# Patient Record
Sex: Female | Born: 1955 | Race: White | Hispanic: No | Marital: Married | Smoking: Current every day smoker
Health system: Southern US, Community
[De-identification: ages and names within clinical notes are randomized; demographics above are authoritative.]

## PROBLEM LIST (undated history)

## (undated) DIAGNOSIS — J449 Chronic obstructive pulmonary disease, unspecified: Secondary | ICD-10-CM

## (undated) DIAGNOSIS — G4730 Sleep apnea, unspecified: Secondary | ICD-10-CM

## (undated) DIAGNOSIS — M1712 Unilateral primary osteoarthritis, left knee: Secondary | ICD-10-CM

## (undated) DIAGNOSIS — I10 Essential (primary) hypertension: Secondary | ICD-10-CM

## (undated) DIAGNOSIS — H9190 Unspecified hearing loss, unspecified ear: Secondary | ICD-10-CM

## (undated) DIAGNOSIS — M47816 Spondylosis without myelopathy or radiculopathy, lumbar region: Secondary | ICD-10-CM

## (undated) DIAGNOSIS — R32 Unspecified urinary incontinence: Secondary | ICD-10-CM

## (undated) DIAGNOSIS — F40240 Claustrophobia: Secondary | ICD-10-CM

## (undated) DIAGNOSIS — F419 Anxiety disorder, unspecified: Secondary | ICD-10-CM

## (undated) DIAGNOSIS — H8109 Meniere's disease, unspecified ear: Secondary | ICD-10-CM

## (undated) DIAGNOSIS — F332 Major depressive disorder, recurrent severe without psychotic features: Secondary | ICD-10-CM

## (undated) DIAGNOSIS — R112 Nausea with vomiting, unspecified: Secondary | ICD-10-CM

## (undated) DIAGNOSIS — K219 Gastro-esophageal reflux disease without esophagitis: Secondary | ICD-10-CM

## (undated) DIAGNOSIS — F329 Major depressive disorder, single episode, unspecified: Secondary | ICD-10-CM

## (undated) DIAGNOSIS — Z9889 Other specified postprocedural states: Secondary | ICD-10-CM

## (undated) DIAGNOSIS — F32A Depression, unspecified: Secondary | ICD-10-CM

## (undated) HISTORY — DX: Meniere's disease, unspecified ear: H81.09

## (undated) HISTORY — DX: Spondylosis without myelopathy or radiculopathy, lumbar region: M47.816

## (undated) HISTORY — PX: CARPAL TUNNEL RELEASE: SHX101

## (undated) HISTORY — DX: Essential (primary) hypertension: I10

## (undated) HISTORY — PX: BLADDER SUSPENSION: SHX72

## (undated) HISTORY — DX: Chronic obstructive pulmonary disease, unspecified: J44.9

## (undated) HISTORY — PX: ABDOMINAL HYSTERECTOMY: SHX81

## (undated) HISTORY — PX: EYE SURGERY: SHX253

## (undated) HISTORY — DX: Major depressive disorder, recurrent severe without psychotic features: F33.2

---

## 1998-08-04 ENCOUNTER — Inpatient Hospital Stay (HOSPITAL_COMMUNITY): Admission: RE | Admit: 1998-08-04 | Discharge: 1998-08-07 | Payer: Self-pay | Admitting: Obstetrics and Gynecology

## 2001-01-02 ENCOUNTER — Other Ambulatory Visit: Admission: RE | Admit: 2001-01-02 | Discharge: 2001-01-02 | Payer: Self-pay | Admitting: Obstetrics and Gynecology

## 2001-12-03 HISTORY — PX: CARPAL TUNNEL RELEASE: SHX101

## 2004-03-16 ENCOUNTER — Other Ambulatory Visit: Admission: RE | Admit: 2004-03-16 | Discharge: 2004-03-16 | Payer: Self-pay | Admitting: Obstetrics and Gynecology

## 2004-12-03 HISTORY — PX: KNEE ARTHROSCOPY: SUR90

## 2005-04-05 ENCOUNTER — Ambulatory Visit (HOSPITAL_COMMUNITY): Admission: RE | Admit: 2005-04-05 | Discharge: 2005-04-06 | Payer: Self-pay | Admitting: Orthopedic Surgery

## 2007-12-23 ENCOUNTER — Ambulatory Visit (HOSPITAL_COMMUNITY): Admission: RE | Admit: 2007-12-23 | Discharge: 2007-12-24 | Payer: Self-pay | Admitting: Obstetrics and Gynecology

## 2011-04-17 NOTE — Op Note (Signed)
[name], [name]               ACCOUNT NO.:  [id number]   MEDICAL RECORD NO.:  [id number]          PATIENT TYPE:  AMB   LOCATION:  SDC                           FACILITY:  [facility]   PHYSICIAN:  Miguel Aschoff, M.D.       DATE OF BIRTH:  1956-01-20   DATE OF PROCEDURE:  12/23/2007  DATE OF DISCHARGE:                               OPERATIVE REPORT   PREOPERATIVE DIAGNOSIS:  Cystocele, rectocele, urinary stress  incontinence with urethrocele.   POSTOPERATIVE DIAGNOSIS:  Cystocele, rectocele, urinary stress  incontinence with urethrocele.   PROCEDURE:  Anterior and posterior colporrhaphy.   SURGEON:  Miguel Aschoff, M.D.   ASSISTANT:  Randye Lobo, M.D.   ANESTHESIA:  General.   COMPLICATIONS:  None.   INDICATIONS FOR PROCEDURE:  The patient is a 55 year old white female  status post prior hysterectomy and Marshall-Marchetti procedure who has  now urinary stress incontinence with urethrocele.  She is noted to have  a cystocele and rectocele.  Because of symptoms associated with the  urinary stress incontinence and prior evaluation by Dr. Conley Simmonds with  urodynamic, she presents now to undergo repair of these pelvic floor  defects as well as a TVT sling which will be placed by Dr. Edward Jolly. The  risks and benefits have been discussed with the patient and informed  consent has been obtained.   PROCEDURE IN DETAIL:  The patient was taken to the operating room and  placed in a supine position.  General anesthesia was administered  without difficulty.  She was then placed in the dorsal lithotomy  position, prepped and draped in the usual sterile fashion.  The bladder  was catheterized.  At this point, the anterior vaginal wall was injected  with 1% Xylocaine with epinephrine and then the anterior wall was  incised with a scalpel and dissected anteriorly and posteriorly to  approximately 2 cm of the urethral orifice and 2 cm to the apex of the  vaginal cuff.  Once this anterior vaginal  wall was opened, the  paravesical fascia was dissected free from the vaginal mucosa and the  cystocele identified and outlined.  At this point, Dr. Edward Jolly proceeded  to place a TVT sling and this was dictated as a separate operative note.  Once the sling was then placed, the cystocele was reduced by serial  pursestring sutures of 2-0 Vicryl followed by imbricating sutures of 2-0  Vicryl with complete closure of the cystocele. At this point, the excess  vaginal mucosa was trimmed and then the mucosa was reapproximated by  using running continuous interlocking 2-0 Vicryl suture. Once this was  done, attention was directed to the posterior vaginal wall.  An ellipse  of perineal skin was then incised and elevated and then the posterior  wall was injected with 1% Xylocaine with epinephrine. At this point, the  vaginal mucosa was separated from the underlying pararectal fascia  without difficulty and, again, it was easy to identify the rectocele.  Once again, the rectocele was reduced using pursestring sutures of 2-0  Vicryl suture and then further reinforced using interrupted 2-0  Vicryl  sutures to reapproximate the pararectal fascia.  At this point, the  excess vaginal mucosa was trimmed and then the mucosa was reapproximated  using running continuous 2-0 Vicryl suture.  At this point, the perineal  body was reconstituted using a 0 Vicryl crown suture bringing the  peritoneal muscles into the midline. Then, the perineal skin was  reapproximated using  subcutaneous 2-0 Vicryl suture followed by subcuticular 2-0 Vicryl  suture. After this was done, a pack was placed in the vaginal vault for  hemostasis.  The estimated blood loss from the procedures was  approximately 200 mL.  The patient tolerated the procedure well and went  to the recovery room in satisfactory condition.      Miguel Aschoff, M.D.  Electronically Signed     AR/MEDQ  D:  12/23/2007  T:  12/23/2007  Job:  [id number]

## 2011-04-17 NOTE — H&P (Signed)
[name], [name]               ACCOUNT NO.:  [id number]   MEDICAL RECORD NO.:  [id number]          PATIENT TYPE:  AMB   LOCATION:  SDC                           FACILITY:  [facility]   PHYSICIAN:  Randye Lobo, M.D.   DATE OF BIRTH:  [birth year]   DATE OF ADMISSION:  DATE OF DISCHARGE:                              HISTORY & PHYSICAL   CHIEF COMPLAINT:  Urinary incontinence.   HISTORY OF PRESENT ILLNESS:  The patient is a 55 year old Caucasian  female, status post total abdominal hysterectomy with bilateral salpingo-  oophorectomy and Marshall-Marchetti-Krantz procedure in 1999, who  presents with a history of urinary incontinence of 3-4 months' duration.  The patient reports leakage of urine with coughing, sneezing, and  laughing which requires the use of Poise absorbent pads.  The patient is  also experiencing leakage of urine without warning.  The patient had  undergone multichannel urodynamic testing in the office on November 24, 2007, which had a uroflow study documenting an intermittent voiding  pattern.  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  The  maximum bladder capacity was noted to be low.   The patient is planning a prolapse repair with Dr. Miguel Aschoff, her  primary gynecologist, and she wishes treatment for her urinary  incontinence during the same surgical procedure.   PAST OBSTETRIC AND GYNECOLOGIC HISTORY:  The patient is status post  ectopic pregnancy. The patient has actually had a tubal ligation with a  tubal reversal.  She is status post total abdominal hysterectomy with  bilateral salpingo-oophorectomy and Marshall-Marchetti-Krantz procedure  in 1999.  Her last Pap smear was performed by Dr. Tenny Rivers in November 2008  and was within normal limits.   PAST MEDICAL HISTORY:  1. Sleep apnea.  2. Hypertension.  3. Hyperlipidemia.  4. Anxiety and depression.  5. Bronchitis.  6. Tobacco  use.  The patient smokes 1 to 1-1/2 packages of cigarettes      per day.   PAST SURGICAL HISTORY:  1. Status post bilateral tubal ligation with tubal reversal.  2. Status post ectopic pregnancy.  3. Status post total abdominal hysterectomy with bilateral salpingo-      oophorectomy and Marshall-Marchetti-Krantz procedure in 1999.   MEDICATIONS:  1. Lotrel.  2. Aleve.  3. Aspirin 81 mg.  4. Nasacort p.r.n.  5. Xanax.  6. Effexor.  7. Z-Pak.   PHYSICAL EXAMINATION:  VITAL SIGNS:  Blood pressure 184/102.  ABDOMEN:  Well-healed Pfannenstiel incision without evidence of  herniation.  The abdomen is soft and nontender and without evidence of  hepatosplenomegaly or organomegaly.  PELVIC:  The patient has normal external genitalia and normal urethra.  There is evidence of genuine stress incontinence with a Valsalva  maneuver performed with very little effort.  A Q-Tip test documents  urethral hypermobility with the Q-Tip extending from +10 to +70 degrees.  There is a first to second-degree cystocele and third-degree rectocele.  The vaginal apex is well supported.  The cervix and uterus are absent by  visualization and palpation.  No adnexal  masses are appreciated.   IMPRESSION:  The patient is a 55 year old female status post Marshall-  Marchetti-Krantz procedure who now presents with mixed incontinence and  incomplete vaginal prolapse.   PLAN:  The patient will proceed with a tension-free vaginal tape  suburethral sling and cystoscopy under my direction.  Dr. Tenny Rivers will be  proceeding with the prolapsed repair of the cystocele and rectocele.  Risks, benefits, and alternatives have been discussed with the patient  who wishes to proceed.   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  She will likely need  anticholinergic therapy  postoperatively.      Randye Lobo, M.D.  Electronically Signed     BES/MEDQ  D:  12/22/2007  T:  12/22/2007  Job:  [id number]

## 2011-04-17 NOTE — Op Note (Signed)
[name], [name]               ACCOUNT NO.:  [id number]   MEDICAL RECORD NO.:  [id number]          PATIENT TYPE:  AMB   LOCATION:  SDC                           FACILITY:  [facility]   PHYSICIAN:  Randye Lobo, M.D.   DATE OF BIRTH:  10-Feb-1956   DATE OF PROCEDURE:  12/23/2007  DATE OF DISCHARGE:                               OPERATIVE REPORT   PREOPERATIVE DIAGNOSIS:  Genuine stress incontinence.   POSTOPERATIVE DIAGNOSIS:  Genuine stress incontinence.   PROCEDURE:  Tension free vaginal tape suburethral sling, cystoscopy.   SURGEON:  Randye Lobo, M.D.   ASSISTANT:  Miguel Aschoff, M.D.   ANESTHESIA:  General endotracheal.   ESTIMATED BLOOD LOSS:  150 mL.   URINE OUTPUT:  Adequate.   COMPLICATIONS:  None.   INDICATIONS FOR PROCEDURE:  The patient is a 55 year old Caucasian  female status post total abdominal hysterectomy with Marshall-Marchetti-  Dorna Mai procedure who presented to her primary gynecologist, Dr. Miguel Aschoff, reporting urinary incontinence.  The patient reported leakage of  urine with stressful maneuvers.  On physical examination, she was noted  to have both a cystocele and rectocele.  The patient underwent further  evaluation with multichannel urodynamic testing which documented  detrusor instability.  Her total urine volume capacity was noted to be  low.  On subsequent examination, the patient was noted to have stress  incontinence with a Valsalva maneuver during pelvic examination and she  was noted to have urethral hypermobility.  A plan is made now to proceed  with a suburethral sling under my direction and the anterior and  posterior colporrhaphy under the direction of Dr. Miguel Aschoff.  The  risks, benefits, and alternatives have been reviewed with the patient  who wishes to proceed.   FINDINGS:  The cystoscopy demonstrated there to be no evidence of  foreign body in the bladder or the urethra.  The bladder was visualized  throughout 360 degrees including  the bladder dome and the trigone.  There was generalized region of hyperemia of the trigone.  The ureters  were noted to be patent bilaterally.   PROCEDURE IN DETAIL:  The patient was reidentified in the preoperative  hold area.  She received PAS stockings for DVT prophylaxis.  She  received clindamycin 900 mg IV for antibiotic prophylaxis.  In the  operating room, general endotracheal anesthesia was induced and the  patient was then placed in the dorsal lithotomy position.  Dr. Tenny Craw  proceeded with the surgery at this time and he sterilely prepped and  draped the patient.  A Foley catheter was placed inside the bladder.   Dr. Tenny Craw began the dissection for the anterior colporrhaphy and  completed the anterior colporrhaphy after I finished placing the  suburethral sling.  He also performed the posterior colporrhaphy.  Please refer to this dictation separately.   After the dissection was completed for the anterior colporrhaphy, I  performed the sling procedure.  The Foley catheter was left to gravity  drainage during this time.  1 cm suprapubic incisions were created 2 cm  to the right and left of  the midline.  The TVT sling was performed in a  top down fashion.  The abdominal needle passer was placed to the right  suprapubic incision and then out through the endopelvic fascia at the  level of the mid urethra and lateral to this on the ipsilateral side.  The same procedure that was performed on the right hand side was then  repeated on the left hand side.  The Foley catheter was removed and  cystoscopy was performed at this time and the findings are as noted  above.  The bladder was then drained on the cystoscopy fluid and the  Foley catheter was replaced.  The TVT sling was then attached to the  abdominal needle passers and drawn up through the suprapubic incisions  bilaterally.  A Kelly clamp was placed between the urethra and the sling  and the plastic sheaths were removed from the  sling.  The sling was  noted to be in excellent position.  Excess sling was trimmed  suprapubically.  Hemostasis was noted to be adequate at this time.  A  small piece of Gelfoam was placed at the exit site of the sling along  the endopelvic fascia vaginally on each side.  Hemostasis was noted to  be good at this time and Dr. Tenny Craw proceeded to complete the remainder of  the surgery.   There were no complications to the surgery.  All needle, instrument, and  sponge counts were correct.  The patient was escorted to the recovery  room in stable and awake condition.      Randye Lobo, M.D.  Electronically Signed     BES/MEDQ  D:  12/23/2007  T:  12/23/2007  Job:  [id number]

## 2011-04-20 NOTE — Op Note (Signed)
[name], Rivers               ACCOUNT NO.:  [id number]   MEDICAL RECORD NO.:  [id number]          PATIENT TYPE:  OIB   LOCATION:  2550                         FACILITY:  [facility]   PHYSICIAN:  Deidre Ala, M.D.    DATE OF BIRTH:  08-16-1956   DATE OF PROCEDURE:  04/05/2005  DATE OF DISCHARGE:                                 OPERATIVE REPORT   PREOPERATIVE DIAGNOSIS:  Left knee degenerative medial meniscus tear.   POSTOPERATIVE DIAGNOSES:  1.  Degenerative medial and lateral meniscus tears.  2.  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  3.  Tight lateral retinaculum.  4.  Large medial and lateral plica.   OPERATION:  1.  Left knee operative arthroscopy with partial meniscectomy, medial and      lateral meniscus, posterior media, inner rim lateral.  2.  Abrasion ablation chondroplasty, tricompartment, knee.  3.  Lateral retinacular release arthroscopically.  4.  Medial and lateral plica incision.   SURGEON:  1.  Charlesetta Shanks, M.D.   ASSISTANT:  Clarene Reamer, P.A.-C.   ANESTHESIA:  General endotracheal.   CULTURES:  None.   DRAINS:  None.   ESTIMATED BLOOD LOSS:  Minimal.   TOURNIQUET TIME:  40 minutes.   PATHOLOGIC FINDINGS AND HISTORY:  [name] is a 55 year old female that we  first started seeing with her knee with chondromalacia, osteoarthritis and  plicas July 13, 2002, almost three years ago.  We have done cortisone  injections, treated her conservatively, and reinjected her again May 18, 2003.  Finally she came to the point where she is having continued  discomfort.  She did not want to do an MRI scan, so she wanted to proceed  with diagnostic and operative arthroscopy.  At surgery we found grade 3  changes over her entire trochlea, medial femoral condyle almost to bone over  the whole medial femoral condyle runner, posterior degenerative medial  meniscus tear, interim  lateral meniscus tear, ACL intact, large synovitic  medial and lateral plicas, and a tight lateral retinaculum, with  chondromalacia and DJD grade 3-4 over her mostly lateral patellar facet,  about 50% of the patella.  At surgery we did abrasion ablation  chondroplasties of all the loose articular cartilage, smoothing it, partial  medial and lateral meniscectomies, posteromedial, lateral, inner rim, to  stable rim, and smoothed part of the medial and lateral tibial plateau, the  medial femoral condyle, the entire trochlea and the posterior patella.  We  then removed both plicas and did an arthroscopic lateral retinacular release  with improved tilt and track.   PROCEDURE:  With adequate anesthesia obtained using endotracheal technique,  1 g Ancef given IV prophylaxis, the patient was placed in the supine  position.  The left lower extremity was prepped from the malleoli to the leg  holder in the standard fashion.  After standard prepping and draping,  Esmarch exsanguination was used.  The tourniquet was let up to 375 mmHg.  Superior lateral inflow portal was made.  The knee was  insufflated with  normal saline with the arthroscopic pump.  Medial and lateral arthroscopic  portals were then made and the joint was sterilely inspected.  I then shaved  out the medial plica to the sidewall and lysed the medial band and  cauterized bleeding points.  I then isolated the medial meniscus and with  basket and shaver trimmed the posterior horn to a stable rim, tapering it.  I then used the shaver and ablator on 1 to smooth the medial femoral  condyle, which came all the way around from the back to the front with the  defect.  I then checked the ACL, did light ablation on it, reversed portals,  trimmed the interim lateral meniscus with basket and shaver and ablator in  an area where she had some chondromalacia of the medial aspect of the  lateral tibial plateau.  I then shaved and smoothed the entire  trochlea  lesion that basically took up the whole trochlea.  I then shaved and  smoothed the posterior patella from both medial and lateral portals.  I  shaved out the lateral gutter synovitis and plica.  I then observed tilt and  track and did an arthroscopic lateral retinacular release with the  ArthroCare hook from the vastus lateralis to the joint line with good  improvement in tilt and track and cautery of the geniculate vessels.  When I  was satisfied with the resections, the knee was irrigated through the scope.  Marcaine 0.5% plain 20 mL injected into the joint along with 4 mg of  morphine.  The portals were left open.  A bulky sterile dressing was applied  with lateral foam pad for tamponade and EasyWrap placed.  The patient then  having tolerated the procedure well was awakened, taken to the recovery room  in satisfactory condition.  She will be monitored for her sleep apnea  postoperatively.  If stable, she will be discharged home to use her CPAP and  told to call the office for an appointment for recheck tomorrow. Given  Percocet for pain.      VEP/MEDQ  D:  04/05/2005  T:  04/05/2005  Job:  [id number]   cc:   Evonnie Pat, M.D.  [place], [place]

## 2011-04-20 NOTE — Discharge Summary (Signed)
[name], [name]               ACCOUNT NO.:  [id number]   MEDICAL RECORD NO.:  [id number]          PATIENT TYPE:  OIB   LOCATION:  9309                          FACILITY:  [facility]   PHYSICIAN:  Miguel Aschoff, M.D.       DATE OF BIRTH:  Apr 24, 1956   DATE OF ADMISSION:  12/23/2007  DATE OF DISCHARGE:  12/24/2007                               DISCHARGE SUMMARY   ADMISSION DIAGNOSIS:  Urinary stress incontinence with cystocele and  rectocele   FINAL DIAGNOSIS:  Urinary stress incontinence with cystocele and  rectocele   OPERATIONS/PROCEDURES:  1. Anterior-posterior colporrhaphy.  2. TVT sling procedure.   COMPLICATIONS:  None.   JUSTIFICATION:  The patient is a 55 year old white female status post  previous hysterectomy and previous Marshall-Marchetti procedure in 1999.  The patient has developed renewed symptoms of stress incontinence.  In  addition, she is noted to have significant defects in the vagina with  anterior-posterior septal defects.  Because of this, she presents now to  undergo therapy for the pelvic floor defects as well as a TVT sling  which was to be placed by Dr. Edward Jolly.   HOSPITAL COURSE:  Preoperative studies were obtained.  The patient was  brought to the operating room on December 23, 2007 and under general  anesthesia, anterior colporrhaphy was carried out by Dr. Miguel Aschoff  followed by TVT sling by Dr. Edward Jolly.  This anterior repair and sling were  carried out without difficulty.  On completion of the anterior vaginal  repair, posterior colporrhaphy was carried out by Dr. Tenny Craw.  The  patient's postoperative course was essentially uncomplicated.  She did  tolerate increasing ambulation and diet well.  By the first  postoperative day, she was to be discharged home.   DISCHARGE INSTRUCTIONS:  1. The patient was instructed to return to see Dr. Edward Jolly in 10 days.  2. Call if there are any problems such as fever, pain or heavy      bleeding.  3. She was instructed to  do no heavy lifting.   DISPOSITION:  She was sent home in satisfactory condition on a regular  diet.      Miguel Aschoff, M.D.  Electronically Signed     AR/MEDQ  D:  01/07/2008  T:  01/07/2008  Job:  [id number]

## 2011-08-23 LAB — PROTIME-INR: Prothrombin Time: 12

## 2011-08-23 LAB — URINALYSIS, ROUTINE W REFLEX MICROSCOPIC
Ketones, ur: NEGATIVE
Nitrite: NEGATIVE
Protein, ur: NEGATIVE
Urobilinogen, UA: 0.2
pH: 6.5

## 2011-08-23 LAB — CBC
HCT: 43.8
MCHC: 34
RDW: 14
RDW: 14.4

## 2013-10-08 DIAGNOSIS — K219 Gastro-esophageal reflux disease without esophagitis: Secondary | ICD-10-CM | POA: Diagnosis not present

## 2013-10-08 DIAGNOSIS — F329 Major depressive disorder, single episode, unspecified: Secondary | ICD-10-CM | POA: Diagnosis not present

## 2013-10-08 DIAGNOSIS — Z23 Encounter for immunization: Secondary | ICD-10-CM | POA: Diagnosis not present

## 2013-10-08 DIAGNOSIS — F411 Generalized anxiety disorder: Secondary | ICD-10-CM | POA: Diagnosis not present

## 2013-10-08 DIAGNOSIS — I10 Essential (primary) hypertension: Secondary | ICD-10-CM | POA: Diagnosis not present

## 2014-05-06 DIAGNOSIS — F329 Major depressive disorder, single episode, unspecified: Secondary | ICD-10-CM | POA: Diagnosis not present

## 2014-05-06 DIAGNOSIS — Z6835 Body mass index (BMI) 35.0-35.9, adult: Secondary | ICD-10-CM | POA: Diagnosis not present

## 2014-05-06 DIAGNOSIS — F411 Generalized anxiety disorder: Secondary | ICD-10-CM | POA: Diagnosis not present

## 2014-05-06 DIAGNOSIS — F3289 Other specified depressive episodes: Secondary | ICD-10-CM | POA: Diagnosis not present

## 2014-05-06 DIAGNOSIS — I10 Essential (primary) hypertension: Secondary | ICD-10-CM | POA: Diagnosis not present

## 2014-07-28 DIAGNOSIS — M6530 Trigger finger, unspecified finger: Secondary | ICD-10-CM | POA: Diagnosis not present

## 2014-07-28 DIAGNOSIS — M25569 Pain in unspecified knee: Secondary | ICD-10-CM | POA: Diagnosis not present

## 2014-09-13 DIAGNOSIS — F329 Major depressive disorder, single episode, unspecified: Secondary | ICD-10-CM | POA: Diagnosis not present

## 2014-09-13 DIAGNOSIS — J209 Acute bronchitis, unspecified: Secondary | ICD-10-CM | POA: Diagnosis not present

## 2014-09-13 DIAGNOSIS — F419 Anxiety disorder, unspecified: Secondary | ICD-10-CM | POA: Diagnosis not present

## 2014-09-13 DIAGNOSIS — M1712 Unilateral primary osteoarthritis, left knee: Secondary | ICD-10-CM | POA: Diagnosis not present

## 2014-09-13 DIAGNOSIS — Z6833 Body mass index (BMI) 33.0-33.9, adult: Secondary | ICD-10-CM | POA: Diagnosis not present

## 2014-09-13 DIAGNOSIS — J019 Acute sinusitis, unspecified: Secondary | ICD-10-CM | POA: Diagnosis not present

## 2014-09-27 DIAGNOSIS — J387 Other diseases of larynx: Secondary | ICD-10-CM | POA: Diagnosis not present

## 2014-09-27 DIAGNOSIS — R042 Hemoptysis: Secondary | ICD-10-CM | POA: Diagnosis not present

## 2014-09-27 DIAGNOSIS — J384 Edema of larynx: Secondary | ICD-10-CM | POA: Diagnosis not present

## 2014-09-27 DIAGNOSIS — F17200 Nicotine dependence, unspecified, uncomplicated: Secondary | ICD-10-CM | POA: Diagnosis not present

## 2014-12-16 DIAGNOSIS — M1712 Unilateral primary osteoarthritis, left knee: Secondary | ICD-10-CM | POA: Diagnosis not present

## 2015-01-25 ENCOUNTER — Other Ambulatory Visit (HOSPITAL_COMMUNITY): Payer: Self-pay

## 2015-01-26 DIAGNOSIS — M1712 Unilateral primary osteoarthritis, left knee: Secondary | ICD-10-CM | POA: Diagnosis not present

## 2015-01-27 ENCOUNTER — Other Ambulatory Visit: Payer: Self-pay | Admitting: Physician Assistant

## 2015-01-27 ENCOUNTER — Encounter (HOSPITAL_COMMUNITY)
Admission: RE | Admit: 2015-01-27 | Discharge: 2015-01-27 | Disposition: A | Discharge status: Home / Self Care | Payer: Managed Care, Other (non HMO) | Source: Ambulatory Visit | Attending: Orthopedic Surgery | Admitting: Orthopedic Surgery

## 2015-01-27 ENCOUNTER — Encounter (HOSPITAL_COMMUNITY): Payer: Self-pay

## 2015-01-27 ENCOUNTER — Other Ambulatory Visit: Payer: Self-pay | Admitting: Orthopedic Surgery

## 2015-01-27 DIAGNOSIS — G4730 Sleep apnea, unspecified: Secondary | ICD-10-CM | POA: Diagnosis not present

## 2015-01-27 DIAGNOSIS — K219 Gastro-esophageal reflux disease without esophagitis: Secondary | ICD-10-CM | POA: Insufficient documentation

## 2015-01-27 DIAGNOSIS — Z01810 Encounter for preprocedural cardiovascular examination: Secondary | ICD-10-CM | POA: Insufficient documentation

## 2015-01-27 DIAGNOSIS — Z01818 Encounter for other preprocedural examination: Secondary | ICD-10-CM | POA: Diagnosis not present

## 2015-01-27 DIAGNOSIS — Z880 Allergy status to penicillin: Secondary | ICD-10-CM | POA: Insufficient documentation

## 2015-01-27 DIAGNOSIS — Z0183 Encounter for blood typing: Secondary | ICD-10-CM | POA: Diagnosis not present

## 2015-01-27 DIAGNOSIS — F17210 Nicotine dependence, cigarettes, uncomplicated: Secondary | ICD-10-CM | POA: Diagnosis not present

## 2015-01-27 DIAGNOSIS — I10 Essential (primary) hypertension: Secondary | ICD-10-CM | POA: Insufficient documentation

## 2015-01-27 DIAGNOSIS — M179 Osteoarthritis of knee, unspecified: Secondary | ICD-10-CM | POA: Diagnosis not present

## 2015-01-27 HISTORY — DX: Sleep apnea, unspecified: G47.30

## 2015-01-27 HISTORY — DX: Nausea with vomiting, unspecified: R11.2

## 2015-01-27 HISTORY — DX: Unspecified urinary incontinence: R32

## 2015-01-27 HISTORY — DX: Anxiety disorder, unspecified: F41.9

## 2015-01-27 HISTORY — DX: Claustrophobia: F40.240

## 2015-01-27 HISTORY — DX: Gastro-esophageal reflux disease without esophagitis: K21.9

## 2015-01-27 HISTORY — DX: Unspecified hearing loss, unspecified ear: H91.90

## 2015-01-27 HISTORY — DX: Essential (primary) hypertension: I10

## 2015-01-27 HISTORY — DX: Major depressive disorder, single episode, unspecified: F32.9

## 2015-01-27 HISTORY — DX: Depression, unspecified: F32.A

## 2015-01-27 HISTORY — DX: Other specified postprocedural states: Z98.890

## 2015-01-27 LAB — CBC WITH DIFFERENTIAL/PLATELET
Basophils Absolute: 0.1 10*3/uL (ref 0.0–0.1)
Basophils Relative: 1 % (ref 0–1)
Eosinophils Absolute: 0.1 10*3/uL (ref 0.0–0.7)
Eosinophils Relative: 1 % (ref 0–5)
HCT: 44.7 % (ref 36.0–46.0)
HEMOGLOBIN: 15.3 g/dL — AB (ref 12.0–15.0)
LYMPHS ABS: 3.1 10*3/uL (ref 0.7–4.0)
Lymphocytes Relative: 33 % (ref 12–46)
MCH: 29.9 pg (ref 26.0–34.0)
MCHC: 34.2 g/dL (ref 30.0–36.0)
MCV: 87.5 fL (ref 78.0–100.0)
MONOS PCT: 4 % (ref 3–12)
Monocytes Absolute: 0.4 10*3/uL (ref 0.1–1.0)
NEUTROS ABS: 5.7 10*3/uL (ref 1.7–7.7)
Neutrophils Relative %: 61 % (ref 43–77)
Platelets: 285 10*3/uL (ref 150–400)
RBC: 5.11 MIL/uL (ref 3.87–5.11)
RDW: 14.4 % (ref 11.5–15.5)
WBC: 9.4 10*3/uL (ref 4.0–10.5)

## 2015-01-27 LAB — COMPREHENSIVE METABOLIC PANEL
ALT: 32 U/L (ref 0–35)
ANION GAP: 14 (ref 5–15)
AST: 33 U/L (ref 0–37)
Albumin: 4.3 g/dL (ref 3.5–5.2)
Alkaline Phosphatase: 93 U/L (ref 39–117)
BILIRUBIN TOTAL: 0.5 mg/dL (ref 0.3–1.2)
BUN: 14 mg/dL (ref 6–23)
CHLORIDE: 103 mmol/L (ref 96–112)
CO2: 24 mmol/L (ref 19–32)
Calcium: 10 mg/dL (ref 8.4–10.5)
Creatinine, Ser: 0.96 mg/dL (ref 0.50–1.10)
GFR calc non Af Amer: 63 mL/min — ABNORMAL LOW (ref >90)
GFR, EST AFRICAN AMERICAN: 74 mL/min — AB (ref >90)
GLUCOSE: 105 mg/dL — AB (ref 70–99)
POTASSIUM: 3.9 mmol/L (ref 3.5–5.1)
SODIUM: 141 mmol/L (ref 135–145)
TOTAL PROTEIN: 7.4 g/dL (ref 6.0–8.3)

## 2015-01-27 LAB — SURGICAL PCR SCREEN
MRSA, PCR: NEGATIVE
Staphylococcus aureus: NEGATIVE

## 2015-01-27 LAB — URINALYSIS, ROUTINE W REFLEX MICROSCOPIC
Bilirubin Urine: NEGATIVE
Glucose, UA: NEGATIVE mg/dL
Ketones, ur: NEGATIVE mg/dL
LEUKOCYTES UA: NEGATIVE
NITRITE: NEGATIVE
PH: 6 (ref 5.0–8.0)
Protein, ur: NEGATIVE mg/dL
Specific Gravity, Urine: 1.019 (ref 1.005–1.030)
Urobilinogen, UA: 0.2 mg/dL (ref 0.0–1.0)

## 2015-01-27 LAB — TYPE AND SCREEN
ABO/RH(D): O POS
ANTIBODY SCREEN: NEGATIVE

## 2015-01-27 LAB — URINE MICROSCOPIC-ADD ON

## 2015-01-27 LAB — PROTIME-INR
INR: 0.95 (ref 0.00–1.49)
Prothrombin Time: 12.7 seconds (ref 11.6–15.2)

## 2015-01-27 LAB — ABO/RH: ABO/RH(D): O POS

## 2015-01-27 LAB — APTT: APTT: 40 s — AB (ref 24–37)

## 2015-01-27 NOTE — H&P (Signed)
TOTAL KNEE ADMISSION H&P  Patient is being admitted for left total knee arthroplasty.  Subjective:  Chief Complaint:left knee pain.  HPI: [name], 59 y.o. female, has a history of pain and functional disability in the left knee due to arthritis and has failed non-surgical conservative treatments for greater than 12 weeks to includeNSAID's and/or analgesics, corticosteriod injections and activity modification.  Onset of symptoms was gradual, starting >10 years ago with gradually worsening course since that time. The patient noted prior procedures on the knee to include  arthroscopy and menisectomy on the left knee(s).  Patient currently rates pain in the left knee(s) at 7 out of 10 with activity. Patient has night pain, worsening of pain with activity and weight bearing, pain that interferes with activities of daily living, crepitus and joint swelling.  Patient has evidence of periarticular osteophytes and joint space narrowing by imaging studies. This patient has had no fracture history. There is no active infection.  There are no active problems to display for this patient.  Past Medical History  Diagnosis Date  . PONV (postoperative nausea and vomiting)   . Hypertension   . Urinary incontinence     has increased urination at bedtime; pt wears pads at bedtime, but still wets the bed  . Anxiety   . Depression   . GERD (gastroesophageal reflux disease)   . Deafness     right ear  . Claustrophobia   . Sleep apnea     moderate; does not wear CPAP     Past Surgical History  Procedure Laterality Date  . Bladder suspension    . Abdominal hysterectomy    . Carpal tunnel release Left   . Knee arthroscopy Left 2006  . Eye surgery      melanoma removed from right eye     (Not in a hospital admission) Allergies  Allergen Reactions  . Lunesta [Eszopiclone] Other (See Comments)    Leaves a "metal taste" in mouth  . Avelox [Moxifloxacin Hcl In Nacl] Swelling and Rash    Tongue  swelling  . Ceclor [Cefaclor] Swelling and Rash    Tongue swelling   . Codeine Swelling and Rash    Tongue swelling   . Penicillins Rash and Other (See Comments)    Tongue swelling     History  Substance Use Topics  . Smoking status: Current Every Day Smoker -- 1.00 packs/day for 8 years    Types: Cigarettes  . Smokeless tobacco: Not on file  . Alcohol Use: No    No family history on file.   Review of Systems  HENT: Positive for hearing loss and tinnitus.   Musculoskeletal: Positive for joint pain.  Psychiatric/Behavioral: Positive for depression. The patient is nervous/anxious.     Objective:  Physical Exam  Constitutional: She is oriented to person, place, and time. She appears well-developed and well-nourished.  HENT:  Head: Normocephalic and atraumatic.  Eyes: EOM are normal. Pupils are equal, round, and reactive to light.  Neck: Normal range of motion. Neck supple.  Cardiovascular: Normal rate, regular rhythm, normal heart sounds and intact distal pulses.   Respiratory: Effort normal and breath sounds normal.  GI: Soft. Bowel sounds are normal.  Musculoskeletal:       Left knee: She exhibits swelling. Tenderness found. Medial joint line and lateral joint line tenderness noted.  Neurological: She is alert and oriented to person, place, and time.  Skin: Skin is warm and dry.  Psychiatric: She has a normal mood and  affect.    Vital signs in last 24 hours: @VSRANGES @  Labs:   Estimated body mass index is 35.27 kg/(m^2) as calculated from the following:   Height as of an earlier encounter on 01/27/15: 5\' 2"  (1.575 m).   Weight as of an earlier encounter on 01/27/15: 87.5 kg (192 lb 14.4 oz).   Imaging Review Plain radiographs demonstrate severe degenerative joint disease of the left knee(s). The overall alignment ismild varus. The bone quality appears to be adequate for age and reported activity level.  Assessment/Plan:  End stage arthritis, left knee   The  patient history, physical examination, clinical judgment of the provider and imaging studies are consistent with end stage degenerative joint disease of the left knee(s) and total knee arthroplasty is deemed medically necessary. The treatment options including medical management, injection therapy arthroscopy and arthroplasty were discussed at length. The risks and benefits of total knee arthroplasty were presented and reviewed. The risks due to aseptic loosening, infection, stiffness, patella tracking problems, thromboembolic complications and other imponderables were discussed. The patient acknowledged the explanation, agreed to proceed with the plan and consent was signed. Patient is being admitted for inpatient treatment for surgery, pain control, PT, OT, prophylactic antibiotics, VTE prophylaxis, progressive ambulation and ADL's and discharge planning. The patient is planning to be discharged home with home health services

## 2015-01-27 NOTE — Progress Notes (Signed)
PCP is Nelda Bucks. Patient informed Nurse that she had a stress test approximately 10 years ago, but denied having a cardiac cath. Patient stated she has moderate sleep apnea but due to claustrophobia she is unable to wear CPAP.   Patient shared a great deal of "past" history during PAT visit. Patient became tearful at times. Total time spent in PAT office was approximately one hour and twenty minutes. Patient thanked Nurse afterwards.

## 2015-01-27 NOTE — Pre-Procedure Instructions (Signed)
[name]  01/27/2015   Your procedure is scheduled on:  Monday February 07, 2015 at 8:45 AM.  Report to [facility] Admitting at 6:45 AM.  Call this number if you have problems the morning of surgery: [phone]   For any other questions Monday-Friday from 8am-4pm call: [phone]   Remember:   Do not eat food or drink liquids after midnight.   Take these medicines the morning of surgery with A SIP OF WATER: Alprazolam (Xanax), Amlodipine (Lotrel), Lansoprazole (Prevacid), and Venlafaxine (Effexor XR)   Do not wear jewelry, make-up or nail polish.  Do not wear lotions, powders, or perfumes.   Do not shave 48 hours prior to surgery.   Do not bring valuables to the hospital.  [facility] is not responsible for any belongings or valuables.               Contacts, dentures or bridgework may not be worn into surgery.  Leave suitcase in the car. After surgery it may be brought to your room.  For patients admitted to the hospital, discharge time is determined by your treatment team.               Patients discharged the day of surgery will not be allowed to drive home.  Name and phone number of your driver:   Special Instructions: Shower using CHG soap the night before and the morning of your surgery   Please read over the following fact sheets that you were given: Pain Booklet, Coughing and Deep Breathing, Blood Transfusion Information, Total Joint Packet, MRSA Information and Surgical Site Infection Prevention

## 2015-01-29 LAB — URINE CULTURE: Colony Count: >=100000

## 2015-01-31 NOTE — H&P (Signed)
TOTAL KNEE ADMISSION H&P  Patient is being admitted for left total knee arthroplasty.  Subjective:  Chief Complaint:left knee pain.  HPI: [name], 59 y.o. female, has a history of pain and functional disability in the left knee due to arthritis and has failed non-surgical conservative treatments for greater than 12 weeks to includeNSAID's and/or analgesics, corticosteriod injections and activity modification.  Onset of symptoms was gradual, starting >10 years ago with gradually worsening course since that time. The patient noted prior procedures on the knee to include  arthroscopy and menisectomy on the left knee(s).  Patient currently rates pain in the left knee(s) at 7 out of 10 with activity. Patient has night pain, worsening of pain with activity and weight bearing, pain that interferes with activities of daily living, crepitus and joint swelling.  Patient has evidence of periarticular osteophytes and joint space narrowing by imaging studies. This patient has had no fracture history. There is no active infection.  There are no active problems to display for this patient.  Past Medical History  Diagnosis Date  . PONV (postoperative nausea and vomiting)   . Hypertension   . Urinary incontinence     has increased urination at bedtime; pt wears pads at bedtime, but still wets the bed  . Anxiety   . Depression   . GERD (gastroesophageal reflux disease)   . Deafness     right ear  . Claustrophobia   . Sleep apnea     moderate; does not wear CPAP     Past Surgical History  Procedure Laterality Date  . Bladder suspension    . Abdominal hysterectomy    . Carpal tunnel release Left   . Knee arthroscopy Left 2006  . Eye surgery      melanoma removed from right eye    No prescriptions prior to admission   Allergies  Allergen Reactions  . Lunesta [Eszopiclone] Other (See Comments)    Leaves a "metal taste" in mouth  . Avelox [Moxifloxacin Hcl In Nacl] Swelling and Rash   Tongue swelling  . Ceclor [Cefaclor] Swelling and Rash    Tongue swelling   . Codeine Swelling and Rash    Tongue swelling   . Penicillins Rash and Other (See Comments)    Tongue swelling     History  Substance Use Topics  . Smoking status: Current Every Day Smoker -- 1.00 packs/day for 8 years    Types: Cigarettes  . Smokeless tobacco: Not on file  . Alcohol Use: No    No family history on file.   ROS  Objective:  Physical Exam  Constitutional: She is oriented to person, place, and time. She appears well-developed and well-nourished.  HENT:  Head: Normocephalic and atraumatic.  Eyes: EOM are normal. Pupils are equal, round, and reactive to light.  Neck: Normal range of motion. Neck supple.  Cardiovascular: Normal rate, regular rhythm, normal heart sounds and intact distal pulses.   Respiratory: Effort normal and breath sounds normal.  GI: Soft. Bowel sounds are normal.  Musculoskeletal:       Left knee: She exhibits swelling. Tenderness found. Medial joint line and lateral joint line tenderness noted.  Neurological: She is alert and oriented to person, place, and time.  Skin: Skin is warm and dry.  Psychiatric: She has a normal mood and affect.    Vital signs in last 24 hours: @VSRANGES @  Labs:   There is no height or weight on file to calculate BMI.   Imaging Review  Plain radiographs demonstrate severe degenerative joint disease of the left knee(s). The overall alignment ismild varus. The bone quality appears to be adequate for age and reported activity level.  Assessment/Plan:  End stage arthritis, left knee   The patient history, physical examination, clinical judgment of the provider and imaging studies are consistent with end stage degenerative joint disease of the left knee(s) and total knee arthroplasty is deemed medically necessary. The treatment options including medical management, injection therapy arthroscopy and arthroplasty were discussed at  length. The risks and benefits of total knee arthroplasty were presented and reviewed. The risks due to aseptic loosening, infection, stiffness, patella tracking problems, thromboembolic complications and other imponderables were discussed. The patient acknowledged the explanation, agreed to proceed with the plan and consent was signed. Patient is being admitted for inpatient treatment for surgery, pain control, PT, OT, prophylactic antibiotics, VTE prophylaxis, progressive ambulation and ADL's and discharge planning. The patient is planning to be discharged home with home health services  Urine had greater than 100,000 colony count of bacteria.  We will use antibiotic in the cement.  PTT was slightly elevated so we will use Aspirin for DVT prophylaxis instead of Eliquis.  Baltasar Twilley A. Kaleen Mask Physician Assistant [facility] [phone]  01/31/2015, 5:52 PM

## 2015-02-01 DIAGNOSIS — Z720 Tobacco use: Secondary | ICD-10-CM | POA: Diagnosis not present

## 2015-02-01 DIAGNOSIS — R7309 Other abnormal glucose: Secondary | ICD-10-CM | POA: Diagnosis not present

## 2015-02-01 DIAGNOSIS — M1712 Unilateral primary osteoarthritis, left knee: Secondary | ICD-10-CM | POA: Diagnosis not present

## 2015-02-01 DIAGNOSIS — I10 Essential (primary) hypertension: Secondary | ICD-10-CM | POA: Diagnosis not present

## 2015-02-06 MED ORDER — CEFAZOLIN SODIUM-DEXTROSE 2-3 GM-% IV SOLR: 2.0000 g | INTRAVENOUS | Status: DC

## 2015-02-06 MED ORDER — VANCOMYCIN HCL IN DEXTROSE 1-5 GM/200ML-% IV SOLN
1000.0000 mg | INTRAVENOUS | Status: AC
  Administered 2015-02-07: 1000 mg via INTRAVENOUS
  Filled 2015-02-06: qty 200

## 2015-02-06 MED ORDER — LACTATED RINGERS IV SOLN: INTRAVENOUS | Status: DC

## 2015-02-07 ENCOUNTER — Inpatient Hospital Stay (HOSPITAL_COMMUNITY)
Admission: RE | Admit: 2015-02-07 | Discharge: 2015-02-08 | DRG: 470 | Disposition: A | Discharge status: Home / Self Care | Payer: Managed Care, Other (non HMO) | Source: Ambulatory Visit | Attending: Orthopedic Surgery | Admitting: Orthopedic Surgery

## 2015-02-07 ENCOUNTER — Inpatient Hospital Stay (HOSPITAL_COMMUNITY): Payer: Managed Care, Other (non HMO)

## 2015-02-07 ENCOUNTER — Encounter (HOSPITAL_COMMUNITY): Payer: Self-pay | Admitting: Anesthesiology

## 2015-02-07 ENCOUNTER — Inpatient Hospital Stay (HOSPITAL_COMMUNITY): Payer: Managed Care, Other (non HMO) | Admitting: Anesthesiology

## 2015-02-07 ENCOUNTER — Encounter (HOSPITAL_COMMUNITY)
Admission: RE | Disposition: A | Discharge status: Home / Self Care | Payer: Self-pay | Source: Ambulatory Visit | Attending: Orthopedic Surgery

## 2015-02-07 DIAGNOSIS — R112 Nausea with vomiting, unspecified: Secondary | ICD-10-CM | POA: Diagnosis present

## 2015-02-07 DIAGNOSIS — M25562 Pain in left knee: Secondary | ICD-10-CM | POA: Diagnosis not present

## 2015-02-07 DIAGNOSIS — G8918 Other acute postprocedural pain: Secondary | ICD-10-CM | POA: Diagnosis not present

## 2015-02-07 DIAGNOSIS — F32A Depression, unspecified: Secondary | ICD-10-CM | POA: Diagnosis present

## 2015-02-07 DIAGNOSIS — F329 Major depressive disorder, single episode, unspecified: Secondary | ICD-10-CM | POA: Diagnosis present

## 2015-02-07 DIAGNOSIS — Z85820 Personal history of malignant melanoma of skin: Secondary | ICD-10-CM

## 2015-02-07 DIAGNOSIS — Z880 Allergy status to penicillin: Secondary | ICD-10-CM | POA: Diagnosis not present

## 2015-02-07 DIAGNOSIS — H9190 Unspecified hearing loss, unspecified ear: Secondary | ICD-10-CM

## 2015-02-07 DIAGNOSIS — G4730 Sleep apnea, unspecified: Secondary | ICD-10-CM | POA: Diagnosis present

## 2015-02-07 DIAGNOSIS — Z888 Allergy status to other drugs, medicaments and biological substances status: Secondary | ICD-10-CM

## 2015-02-07 DIAGNOSIS — Z01818 Encounter for other preprocedural examination: Secondary | ICD-10-CM | POA: Diagnosis not present

## 2015-02-07 DIAGNOSIS — I10 Essential (primary) hypertension: Secondary | ICD-10-CM | POA: Diagnosis not present

## 2015-02-07 DIAGNOSIS — Z885 Allergy status to narcotic agent status: Secondary | ICD-10-CM

## 2015-02-07 DIAGNOSIS — M1712 Unilateral primary osteoarthritis, left knee: Principal | ICD-10-CM | POA: Diagnosis present

## 2015-02-07 DIAGNOSIS — Z9889 Other specified postprocedural states: Secondary | ICD-10-CM | POA: Diagnosis present

## 2015-02-07 DIAGNOSIS — F419 Anxiety disorder, unspecified: Secondary | ICD-10-CM | POA: Diagnosis present

## 2015-02-07 DIAGNOSIS — M1710 Unilateral primary osteoarthritis, unspecified knee: Secondary | ICD-10-CM | POA: Diagnosis present

## 2015-02-07 DIAGNOSIS — F40240 Claustrophobia: Secondary | ICD-10-CM | POA: Diagnosis present

## 2015-02-07 DIAGNOSIS — Z90710 Acquired absence of both cervix and uterus: Secondary | ICD-10-CM | POA: Diagnosis not present

## 2015-02-07 DIAGNOSIS — K219 Gastro-esophageal reflux disease without esophagitis: Secondary | ICD-10-CM | POA: Diagnosis present

## 2015-02-07 DIAGNOSIS — M179 Osteoarthritis of knee, unspecified: Secondary | ICD-10-CM | POA: Diagnosis present

## 2015-02-07 DIAGNOSIS — R32 Unspecified urinary incontinence: Secondary | ICD-10-CM | POA: Diagnosis present

## 2015-02-07 DIAGNOSIS — F17210 Nicotine dependence, cigarettes, uncomplicated: Secondary | ICD-10-CM | POA: Diagnosis present

## 2015-02-07 HISTORY — DX: Unilateral primary osteoarthritis, left knee: M17.12

## 2015-02-07 HISTORY — DX: Osteoarthritis of knee, unspecified: M17.9

## 2015-02-07 HISTORY — PX: TOTAL KNEE ARTHROPLASTY: SHX125

## 2015-02-07 LAB — CBC
HCT: 39.9 % (ref 36.0–46.0)
Hemoglobin: 13.4 g/dL (ref 12.0–15.0)
MCH: 29.3 pg (ref 26.0–34.0)
MCHC: 33.6 g/dL (ref 30.0–36.0)
MCV: 87.1 fL (ref 78.0–100.0)
Platelets: 252 10*3/uL (ref 150–400)
RBC: 4.58 MIL/uL (ref 3.87–5.11)
RDW: 14.3 % (ref 11.5–15.5)
WBC: 12.5 10*3/uL — ABNORMAL HIGH (ref 4.0–10.5)

## 2015-02-07 LAB — CREATININE, SERUM
Creatinine, Ser: 0.96 mg/dL (ref 0.50–1.10)
GFR, EST AFRICAN AMERICAN: 74 mL/min — AB (ref >90)
GFR, EST NON AFRICAN AMERICAN: 63 mL/min — AB (ref >90)

## 2015-02-07 SURGERY — ARTHROPLASTY, KNEE, TOTAL
Anesthesia: General | Site: Knee | Laterality: Left

## 2015-02-07 MED ORDER — DEXAMETHASONE SODIUM PHOSPHATE 10 MG/ML IJ SOLN
10.0000 mg | Freq: Three times a day (TID) | INTRAMUSCULAR | Status: DC
  Administered 2015-02-07 – 2015-02-08 (×3): 10 mg via INTRAVENOUS
  Filled 2015-02-07 (×3): qty 1

## 2015-02-07 MED ORDER — ONDANSETRON HCL 4 MG/2ML IJ SOLN
INTRAMUSCULAR | Status: AC
  Filled 2015-02-07: qty 2

## 2015-02-07 MED ORDER — POLYETHYLENE GLYCOL 3350 17 G PO PACK
17.0000 g | PACK | Freq: Two times a day (BID) | ORAL | Status: DC
  Administered 2015-02-07 (×2): 17 g via ORAL
  Filled 2015-02-07 (×2): qty 1

## 2015-02-07 MED ORDER — GLYCOPYRROLATE 0.2 MG/ML IJ SOLN
INTRAMUSCULAR | Status: AC
  Filled 2015-02-07: qty 3

## 2015-02-07 MED ORDER — VENLAFAXINE HCL ER 150 MG PO CP24
150.0000 mg | ORAL_CAPSULE | Freq: Every day | ORAL | Status: DC
Start: 2015-02-08 — End: 2015-02-08
  Administered 2015-02-08: 150 mg via ORAL
  Filled 2015-02-07: qty 1

## 2015-02-07 MED ORDER — LACTATED RINGERS IV SOLN
INTRAVENOUS | Status: DC | PRN
  Administered 2015-02-07 (×3): via INTRAVENOUS

## 2015-02-07 MED ORDER — ROCURONIUM BROMIDE 50 MG/5ML IV SOLN
INTRAVENOUS | Status: AC
  Filled 2015-02-07: qty 1

## 2015-02-07 MED ORDER — DIPHENHYDRAMINE HCL 12.5 MG/5ML PO ELIX: 12.5000 mg | ORAL_SOLUTION | ORAL | Status: DC | PRN

## 2015-02-07 MED ORDER — EZETIMIBE 10 MG PO TABS
10.0000 mg | ORAL_TABLET | Freq: Every day | ORAL | Status: DC
  Administered 2015-02-07 – 2015-02-08 (×2): 10 mg via ORAL
  Filled 2015-02-07 (×2): qty 1

## 2015-02-07 MED ORDER — LACTATED RINGERS IV SOLN
INTRAVENOUS | Status: DC
  Administered 2015-02-07: 08:00:00 via INTRAVENOUS

## 2015-02-07 MED ORDER — MENTHOL 3 MG MT LOZG: 1.0000 | LOZENGE | OROMUCOSAL | Status: DC | PRN

## 2015-02-07 MED ORDER — CHLORHEXIDINE GLUCONATE 4 % EX LIQD: 60.0000 mL | Freq: Once | CUTANEOUS | Status: DC

## 2015-02-07 MED ORDER — ALUM & MAG HYDROXIDE-SIMETH 200-200-20 MG/5ML PO SUSP: 30.0000 mL | ORAL | Status: DC | PRN

## 2015-02-07 MED ORDER — PHENYLEPHRINE HCL 10 MG/ML IJ SOLN
INTRAMUSCULAR | Status: DC | PRN
  Administered 2015-02-07: 40 ug via INTRAVENOUS

## 2015-02-07 MED ORDER — METOCLOPRAMIDE HCL 10 MG PO TABS: 5.0000 mg | ORAL_TABLET | Freq: Three times a day (TID) | ORAL | Status: DC | PRN

## 2015-02-07 MED ORDER — LIDOCAINE HCL (CARDIAC) 20 MG/ML IV SOLN
INTRAVENOUS | Status: AC
  Filled 2015-02-07: qty 5

## 2015-02-07 MED ORDER — LACTATED RINGERS IV SOLN: INTRAVENOUS | Status: DC

## 2015-02-07 MED ORDER — IRBESARTAN 300 MG PO TABS
300.0000 mg | ORAL_TABLET | Freq: Every day | ORAL | Status: DC
  Administered 2015-02-08: 300 mg via ORAL
  Filled 2015-02-07 (×2): qty 1

## 2015-02-07 MED ORDER — ROCURONIUM BROMIDE 100 MG/10ML IV SOLN
INTRAVENOUS | Status: DC | PRN
  Administered 2015-02-07: 30 mg via INTRAVENOUS

## 2015-02-07 MED ORDER — SUCCINYLCHOLINE CHLORIDE 20 MG/ML IJ SOLN
INTRAMUSCULAR | Status: AC
  Filled 2015-02-07: qty 1

## 2015-02-07 MED ORDER — ONDANSETRON HCL 4 MG/2ML IJ SOLN: 4.0000 mg | Freq: Once | INTRAMUSCULAR | Status: DC | PRN

## 2015-02-07 MED ORDER — CELECOXIB 200 MG PO CAPS
200.0000 mg | ORAL_CAPSULE | Freq: Two times a day (BID) | ORAL | Status: DC
  Administered 2015-02-07 – 2015-02-08 (×3): 200 mg via ORAL
  Filled 2015-02-07 (×3): qty 1

## 2015-02-07 MED ORDER — HYDROCHLOROTHIAZIDE 12.5 MG PO CAPS
12.5000 mg | ORAL_CAPSULE | Freq: Every day | ORAL | Status: DC
  Administered 2015-02-08: 12.5 mg via ORAL
  Filled 2015-02-07 (×2): qty 1

## 2015-02-07 MED ORDER — PROPOFOL 10 MG/ML IV BOLUS
INTRAVENOUS | Status: DC | PRN
  Administered 2015-02-07: 50 mg via INTRAVENOUS
  Administered 2015-02-07: 200 mg via INTRAVENOUS
  Administered 2015-02-07: 50 mg via INTRAVENOUS

## 2015-02-07 MED ORDER — MORPHINE SULFATE 2 MG/ML IJ SOLN
2.0000 mg | INTRAMUSCULAR | Status: DC | PRN
  Administered 2015-02-07: 2 mg via INTRAVENOUS
  Administered 2015-02-07: 4 mg via INTRAVENOUS
  Administered 2015-02-08: 2 mg via INTRAVENOUS
  Administered 2015-02-08 (×2): 4 mg via INTRAVENOUS
  Filled 2015-02-07 (×2): qty 2
  Filled 2015-02-07 (×2): qty 1
  Filled 2015-02-07: qty 2

## 2015-02-07 MED ORDER — ACETAMINOPHEN 325 MG PO TABS: 650.0000 mg | ORAL_TABLET | Freq: Four times a day (QID) | ORAL | Status: DC | PRN

## 2015-02-07 MED ORDER — GLYCOPYRROLATE 0.2 MG/ML IJ SOLN
INTRAMUSCULAR | Status: DC | PRN
  Administered 2015-02-07: 0.2 mg via INTRAVENOUS
  Administered 2015-02-07: 0.4 mg via INTRAVENOUS

## 2015-02-07 MED ORDER — PANTOPRAZOLE SODIUM 20 MG PO TBEC
20.0000 mg | DELAYED_RELEASE_TABLET | Freq: Every day | ORAL | Status: DC
  Administered 2015-02-08: 20 mg via ORAL
  Filled 2015-02-07: qty 1

## 2015-02-07 MED ORDER — 0.9 % SODIUM CHLORIDE (POUR BTL) OPTIME
TOPICAL | Status: DC | PRN
  Administered 2015-02-07: 1000 mL

## 2015-02-07 MED ORDER — FENTANYL CITRATE 0.05 MG/ML IJ SOLN
INTRAMUSCULAR | Status: AC
  Filled 2015-02-07: qty 5

## 2015-02-07 MED ORDER — ONDANSETRON HCL 4 MG/2ML IJ SOLN
4.0000 mg | Freq: Four times a day (QID) | INTRAMUSCULAR | Status: DC | PRN
Start: 2015-02-07 — End: 2015-02-08

## 2015-02-07 MED ORDER — CLINDAMYCIN PHOSPHATE 600 MG/50ML IV SOLN
600.0000 mg | Freq: Four times a day (QID) | INTRAVENOUS | Status: AC
  Administered 2015-02-07 (×2): 600 mg via INTRAVENOUS
  Filled 2015-02-07 (×2): qty 50

## 2015-02-07 MED ORDER — NEOSTIGMINE METHYLSULFATE 10 MG/10ML IV SOLN
INTRAVENOUS | Status: DC | PRN
  Administered 2015-02-07: 3 mg via INTRAVENOUS

## 2015-02-07 MED ORDER — ALBUTEROL SULFATE HFA 108 (90 BASE) MCG/ACT IN AERS
INHALATION_SPRAY | RESPIRATORY_TRACT | Status: AC
  Filled 2015-02-07: qty 6.7

## 2015-02-07 MED ORDER — GLYCOPYRROLATE 0.2 MG/ML IJ SOLN
INTRAMUSCULAR | Status: AC
  Filled 2015-02-07: qty 1

## 2015-02-07 MED ORDER — ACETAMINOPHEN 650 MG RE SUPP: 650.0000 mg | Freq: Four times a day (QID) | RECTAL | Status: DC | PRN

## 2015-02-07 MED ORDER — BUPIVACAINE-EPINEPHRINE 0.25% -1:200000 IJ SOLN
INTRAMUSCULAR | Status: DC | PRN
  Administered 2015-02-07: 30 mL

## 2015-02-07 MED ORDER — SODIUM CHLORIDE 0.9 % IR SOLN
Status: DC | PRN
  Administered 2015-02-07: 3000 mL

## 2015-02-07 MED ORDER — PROPOFOL 10 MG/ML IV BOLUS
INTRAVENOUS | Status: AC
  Filled 2015-02-07: qty 20

## 2015-02-07 MED ORDER — ALPRAZOLAM 0.5 MG PO TABS
0.5000 mg | ORAL_TABLET | Freq: Three times a day (TID) | ORAL | Status: DC | PRN
  Administered 2015-02-08: 0.5 mg via ORAL
  Filled 2015-02-07: qty 1

## 2015-02-07 MED ORDER — PHENYLEPHRINE 40 MCG/ML (10ML) SYRINGE FOR IV PUSH (FOR BLOOD PRESSURE SUPPORT)
PREFILLED_SYRINGE | INTRAVENOUS | Status: AC
  Filled 2015-02-07: qty 10

## 2015-02-07 MED ORDER — HYDROMORPHONE HCL 1 MG/ML IJ SOLN
0.2500 mg | INTRAMUSCULAR | Status: DC | PRN
  Administered 2015-02-07 (×2): 0.5 mg via INTRAVENOUS

## 2015-02-07 MED ORDER — BUPIVACAINE-EPINEPHRINE (PF) 0.25% -1:200000 IJ SOLN
INTRAMUSCULAR | Status: AC
  Filled 2015-02-07: qty 30

## 2015-02-07 MED ORDER — ENOXAPARIN SODIUM 30 MG/0.3ML ~~LOC~~ SOLN
30.0000 mg | Freq: Two times a day (BID) | SUBCUTANEOUS | Status: DC
  Administered 2015-02-08: 30 mg via SUBCUTANEOUS
  Filled 2015-02-07: qty 0.3

## 2015-02-07 MED ORDER — LIDOCAINE HCL (CARDIAC) 20 MG/ML IV SOLN
INTRAVENOUS | Status: DC | PRN
  Administered 2015-02-07: 100 mg via INTRATRACHEAL
  Administered 2015-02-07: 100 mg via INTRAVENOUS

## 2015-02-07 MED ORDER — DEXAMETHASONE SODIUM PHOSPHATE 10 MG/ML IJ SOLN
INTRAMUSCULAR | Status: DC | PRN
  Administered 2015-02-07: 10 mg via INTRAVENOUS

## 2015-02-07 MED ORDER — DEXTROSE 5 % IV SOLN
INTRAVENOUS | Status: DC | PRN
  Administered 2015-02-07: 08:00:00 via INTRAVENOUS

## 2015-02-07 MED ORDER — MIDAZOLAM HCL 2 MG/2ML IJ SOLN
INTRAMUSCULAR | Status: AC
  Filled 2015-02-07: qty 2

## 2015-02-07 MED ORDER — VALSARTAN-HYDROCHLOROTHIAZIDE 320-12.5 MG PO TABS: 1.0000 | ORAL_TABLET | Freq: Every day | ORAL | Status: DC

## 2015-02-07 MED ORDER — BENAZEPRIL HCL 20 MG PO TABS
20.0000 mg | ORAL_TABLET | Freq: Every day | ORAL | Status: DC
  Administered 2015-02-08: 20 mg via ORAL
  Filled 2015-02-07: qty 1

## 2015-02-07 MED ORDER — ALBUTEROL SULFATE HFA 108 (90 BASE) MCG/ACT IN AERS
INHALATION_SPRAY | RESPIRATORY_TRACT | Status: DC | PRN
  Administered 2015-02-07: 2 via RESPIRATORY_TRACT

## 2015-02-07 MED ORDER — AMLODIPINE BESY-BENAZEPRIL HCL 5-20 MG PO CAPS: 1.0000 | ORAL_CAPSULE | Freq: Every day | ORAL | Status: DC

## 2015-02-07 MED ORDER — DEXAMETHASONE SODIUM PHOSPHATE 10 MG/ML IJ SOLN
INTRAMUSCULAR | Status: AC
  Filled 2015-02-07: qty 1

## 2015-02-07 MED ORDER — PHENOL 1.4 % MT LIQD: 1.0000 | OROMUCOSAL | Status: DC | PRN

## 2015-02-07 MED ORDER — FENTANYL CITRATE 0.05 MG/ML IJ SOLN
INTRAMUSCULAR | Status: DC | PRN
  Administered 2015-02-07 (×7): 50 ug via INTRAVENOUS
  Administered 2015-02-07: 100 ug via INTRAVENOUS

## 2015-02-07 MED ORDER — FENTANYL CITRATE 0.05 MG/ML IJ SOLN
INTRAMUSCULAR | Status: AC
  Administered 2015-02-07: 100 ug via INTRAVENOUS
  Filled 2015-02-07: qty 2

## 2015-02-07 MED ORDER — HYDROMORPHONE HCL 2 MG PO TABS
2.0000 mg | ORAL_TABLET | ORAL | Status: DC | PRN
Start: 2015-02-07 — End: 2015-02-08
  Administered 2015-02-07 – 2015-02-08 (×6): 4 mg via ORAL
  Filled 2015-02-07 (×6): qty 2

## 2015-02-07 MED ORDER — METOCLOPRAMIDE HCL 5 MG/ML IJ SOLN: 5.0000 mg | Freq: Three times a day (TID) | INTRAMUSCULAR | Status: DC | PRN

## 2015-02-07 MED ORDER — NEOSTIGMINE METHYLSULFATE 10 MG/10ML IV SOLN
INTRAVENOUS | Status: AC
  Filled 2015-02-07: qty 1

## 2015-02-07 MED ORDER — ONDANSETRON HCL 4 MG PO TABS: 4.0000 mg | ORAL_TABLET | Freq: Four times a day (QID) | ORAL | Status: DC | PRN

## 2015-02-07 MED ORDER — LABETALOL HCL 5 MG/ML IV SOLN
INTRAVENOUS | Status: DC | PRN
  Administered 2015-02-07: 5 mg via INTRAVENOUS

## 2015-02-07 MED ORDER — POTASSIUM CHLORIDE IN NACL 20-0.9 MEQ/L-% IV SOLN
INTRAVENOUS | Status: DC
  Filled 2015-02-07 (×4): qty 1000

## 2015-02-07 MED ORDER — SUCCINYLCHOLINE CHLORIDE 20 MG/ML IJ SOLN
INTRAMUSCULAR | Status: DC | PRN
  Administered 2015-02-07: 100 mg via INTRAVENOUS

## 2015-02-07 MED ORDER — HYDROMORPHONE HCL 1 MG/ML IJ SOLN
INTRAMUSCULAR | Status: AC
  Filled 2015-02-07: qty 1

## 2015-02-07 MED ORDER — AMLODIPINE BESYLATE 5 MG PO TABS
5.0000 mg | ORAL_TABLET | Freq: Every day | ORAL | Status: DC
  Administered 2015-02-08: 5 mg via ORAL
  Filled 2015-02-07: qty 1

## 2015-02-07 MED ORDER — SODIUM CHLORIDE 0.9 % IJ SOLN
INTRAMUSCULAR | Status: AC
  Filled 2015-02-07: qty 10

## 2015-02-07 MED ORDER — ONDANSETRON HCL 4 MG/2ML IJ SOLN
INTRAMUSCULAR | Status: DC | PRN
  Administered 2015-02-07: 4 mg via INTRAVENOUS

## 2015-02-07 MED ORDER — DOCUSATE SODIUM 100 MG PO CAPS
100.0000 mg | ORAL_CAPSULE | Freq: Two times a day (BID) | ORAL | Status: DC
  Administered 2015-02-07 – 2015-02-08 (×2): 100 mg via ORAL
  Filled 2015-02-07 (×2): qty 1

## 2015-02-07 MED ORDER — EPHEDRINE SULFATE 50 MG/ML IJ SOLN
INTRAMUSCULAR | Status: AC
  Filled 2015-02-07: qty 1

## 2015-02-07 SURGICAL SUPPLY — 76 items
BANDAGE ESMARK 6X9 LF (GAUZE/BANDAGES/DRESSINGS) ×1 IMPLANT
BENZOIN TINCTURE PRP APPL 2/3 (GAUZE/BANDAGES/DRESSINGS) ×3 IMPLANT
BLADE SAGITTAL 25.0X1.19X90 (BLADE) ×2 IMPLANT
BLADE SAGITTAL 25.0X1.19X90MM (BLADE) ×1
BLADE SAW SGTL 11.0X1.19X90.0M (BLADE) IMPLANT
BLADE SAW SGTL 13.0X1.19X90.0M (BLADE) ×3 IMPLANT
BLADE SURG 10 STRL SS (BLADE) ×15 IMPLANT
BNDG ELASTIC 6X15 VLCR STRL LF (GAUZE/BANDAGES/DRESSINGS) ×3 IMPLANT
BNDG ESMARK 6X9 LF (GAUZE/BANDAGES/DRESSINGS) ×3
BOWL SMART MIX CTS (DISPOSABLE) ×3 IMPLANT
CAPT KNEE TOTAL 3 ATTUNE ×3 IMPLANT
CEMENT HV SMART SET (Cement) ×6 IMPLANT
CLOSURE WOUND 1/2 X4 (GAUZE/BANDAGES/DRESSINGS) ×1
COVER SURGICAL LIGHT HANDLE (MISCELLANEOUS) ×6 IMPLANT
CUFF TOURNIQUET SINGLE 34IN LL (TOURNIQUET CUFF) ×3 IMPLANT
DRAPE EXTREMITY T 121X128X90 (DRAPE) ×3 IMPLANT
DRAPE IMP U-DRAPE 54X76 (DRAPES) ×3 IMPLANT
DRAPE INCISE IOBAN 66X45 STRL (DRAPES) ×3 IMPLANT
DRAPE PROXIMA HALF (DRAPES) ×3 IMPLANT
DRAPE U-SHAPE 47X51 STRL (DRAPES) ×3 IMPLANT
DRSG AQUACEL AG ADV 3.5X14 (GAUZE/BANDAGES/DRESSINGS) ×3 IMPLANT
DRSG PAD ABDOMINAL 8X10 ST (GAUZE/BANDAGES/DRESSINGS) ×6 IMPLANT
DURAPREP 26ML APPLICATOR (WOUND CARE) ×6 IMPLANT
ELECT CAUTERY BLADE 6.4 (BLADE) ×3 IMPLANT
ELECT REM PT RETURN 9FT ADLT (ELECTROSURGICAL) ×3
ELECTRODE REM PT RTRN 9FT ADLT (ELECTROSURGICAL) ×1 IMPLANT
EVACUATOR 1/8 PVC DRAIN (DRAIN) ×3 IMPLANT
FACESHIELD WRAPAROUND (MASK) ×3 IMPLANT
GAUZE SPONGE 4X4 12PLY STRL (GAUZE/BANDAGES/DRESSINGS) ×3 IMPLANT
GLOVE BIO SURGEON STRL SZ 6.5 (GLOVE) ×2 IMPLANT
GLOVE BIO SURGEON STRL SZ7 (GLOVE) ×3 IMPLANT
GLOVE BIO SURGEON STRL SZ7.5 (GLOVE) ×6 IMPLANT
GLOVE BIO SURGEONS STRL SZ 6.5 (GLOVE) ×1
GLOVE BIOGEL PI IND STRL 7.0 (GLOVE) ×6 IMPLANT
GLOVE BIOGEL PI IND STRL 7.5 (GLOVE) ×1 IMPLANT
GLOVE BIOGEL PI INDICATOR 7.0 (GLOVE) ×12
GLOVE BIOGEL PI INDICATOR 7.5 (GLOVE) ×2
GLOVE BIOGEL PI ORTHO PRO SZ7 (GLOVE) ×2
GLOVE PI ORTHO PRO STRL SZ7 (GLOVE) ×1 IMPLANT
GLOVE SS BIOGEL STRL SZ 7.5 (GLOVE) ×1 IMPLANT
GLOVE SUPERSENSE BIOGEL SZ 7.5 (GLOVE) ×2
GLOVE SURG SS PI 7.0 STRL IVOR (GLOVE) ×12 IMPLANT
GOWN STRL REUS W/ TWL LRG LVL3 (GOWN DISPOSABLE) ×2 IMPLANT
GOWN STRL REUS W/ TWL XL LVL3 (GOWN DISPOSABLE) ×2 IMPLANT
GOWN STRL REUS W/TWL LRG LVL3 (GOWN DISPOSABLE) ×4
GOWN STRL REUS W/TWL XL LVL3 (GOWN DISPOSABLE) ×4
HANDPIECE INTERPULSE COAX TIP (DISPOSABLE) ×2
HOOD PEEL AWAY FACE SHEILD DIS (HOOD) ×9 IMPLANT
IMMOBILIZER KNEE 22 UNIV (SOFTGOODS) ×3 IMPLANT
KIT BASIN OR (CUSTOM PROCEDURE TRAY) ×3 IMPLANT
KIT ROOM TURNOVER OR (KITS) ×3 IMPLANT
MANIFOLD NEPTUNE II (INSTRUMENTS) ×3 IMPLANT
MARKER SKIN DUAL TIP RULER LAB (MISCELLANEOUS) ×3 IMPLANT
NS IRRIG 1000ML POUR BTL (IV SOLUTION) ×3 IMPLANT
PACK TOTAL JOINT (CUSTOM PROCEDURE TRAY) ×3 IMPLANT
PACK UNIVERSAL I (CUSTOM PROCEDURE TRAY) ×3 IMPLANT
PAD ARMBOARD 7.5X6 YLW CONV (MISCELLANEOUS) ×6 IMPLANT
PADDING CAST COTTON 6X4 STRL (CAST SUPPLIES) ×3 IMPLANT
RUBBERBAND STERILE (MISCELLANEOUS) ×3 IMPLANT
SET HNDPC FAN SPRY TIP SCT (DISPOSABLE) ×1 IMPLANT
STRIP CLOSURE SKIN 1/2X4 (GAUZE/BANDAGES/DRESSINGS) ×2 IMPLANT
SUCTION FRAZIER TIP 10 FR DISP (SUCTIONS) ×3 IMPLANT
SUT ETHIBOND NAB CT1 #1 30IN (SUTURE) ×6 IMPLANT
SUT MNCRL AB 3-0 PS2 18 (SUTURE) ×3 IMPLANT
SUT VIC AB 0 CT1 27 (SUTURE) ×6
SUT VIC AB 0 CT1 27XBRD ANBCTR (SUTURE) ×3 IMPLANT
SUT VIC AB 2-0 CT1 27 (SUTURE) ×4
SUT VIC AB 2-0 CT1 TAPERPNT 27 (SUTURE) ×2 IMPLANT
SYR 30ML SLIP (SYRINGE) ×3 IMPLANT
TOWEL OR 17X24 6PK STRL BLUE (TOWEL DISPOSABLE) ×3 IMPLANT
TOWEL OR 17X26 10 PK STRL BLUE (TOWEL DISPOSABLE) ×3 IMPLANT
TRAY FOLEY CATH 16FR SILVER (SET/KITS/TRAYS/PACK) ×3 IMPLANT
TUBE CONNECTING 12'X1/4 (SUCTIONS) ×1
TUBE CONNECTING 12X1/4 (SUCTIONS) ×2 IMPLANT
WATER STERILE IRR 1000ML POUR (IV SOLUTION) ×6 IMPLANT
YANKAUER SUCT BULB TIP NO VENT (SUCTIONS) ×3 IMPLANT

## 2015-02-07 NOTE — Progress Notes (Signed)
Orthopedic Tech Progress Note Patient Details:  [name] 1956/05/21 [id number] CPM applied to LLE with appropriate settings. OHF applied to bed. Footsie roll provided.  CPM Left Knee CPM Left Knee: On Left Knee Flexion (Degrees): 90 Left Knee Extension (Degrees): 0   Asia R Thompson 02/07/2015, 12:18 PM

## 2015-02-07 NOTE — Progress Notes (Signed)
Utilization review completed.  

## 2015-02-07 NOTE — Evaluation (Signed)
Physical Therapy Evaluation Patient Details Name: [name] MRN: [id number] DOB: 1956/07/15 Today's Date: 02/07/2015   History of Present Illness  Pt is a 59 y/o F s/p L TKA on 02/07/15 w/ PMH of L knee menisectomy, anxiety, and L carpal tunnel release.  Clinical Impression  Pt was able to ambulate 10 ft today and tolerated L TKA exercises.  Pt will need to demonstrate ability to ascend/descnd 1 stair w/o railings prior to d/c.  Pt will benefit from the continuation of PT to increase functional independence and safety.    Follow Up Recommendations Home health PT;Supervision/Assistance - 24 hour    Equipment Recommendations  None recommended by PT    Recommendations for Other Services       Precautions / Restrictions Precautions Precautions: Fall;Knee Required Braces or Orthoses: Knee Immobilizer - Left Restrictions Weight Bearing Restrictions: Yes (WBAT LLE)      Mobility  Bed Mobility Overal bed mobility: Needs Assistance Bed Mobility: Supine to Sit     Supine to sit: Min assist     General bed mobility comments: Min A moving LLE EOB  Transfers Overall transfer level: Needs assistance Equipment used: Rolling walker (2 wheeled) Transfers: Sit to/from Stand Sit to Stand: Min assist (verbal cues to push from bed rather than pulling from RW)            Ambulation/Gait Ambulation/Gait assistance: Min assist Ambulation Distance (Feet): 10 Feet Assistive device: Rolling walker (2 wheeled) Gait Pattern/deviations: Step-to pattern;Decreased step length - left;Antalgic;Decreased stance time - left Gait velocity: decreased Gait velocity interpretation: Below normal speed for age/gender General Gait Details: verbal cues to slow down and keep walker close to body  Stairs            Wheelchair Mobility    Modified Rankin (Stroke Patients Only)       Balance Overall balance assessment: Needs assistance Sitting-balance support: Bilateral upper extremity  supported;Feet supported Sitting balance-Leahy Scale: Poor     Standing balance support: Bilateral upper extremity supported Standing balance-Leahy Scale: Poor                               Pertinent Vitals/Pain Pain Assessment: 0-10 Pain Score: 7  Pain Location: L knee Pain Descriptors / Indicators: Aching;Discomfort;Dull Pain Intervention(s): Limited activity within patient's tolerance;Monitored during session;Repositioned    Home Living Family/patient expects to be discharged to:: Private residence Living Arrangements: Spouse/significant other Available Help at Discharge: Available 24 hours/day;Family (husband is taking a full week off of work) Type of Home: House Home Access: Stairs to enter (1 to go down to get into home) Entrance Stairs-Rails: None Entrance Stairs-Number of Steps: 1 Home Layout: One level;Able to live on main level with bedroom/bathroom Home Equipment: Gilford Rile - 2 wheels;Toilet riser      Prior Function Level of Independence: Independent               Hand Dominance   Dominant Hand: Right    Extremity/Trunk Assessment               Lower Extremity Assessment: Generalized weakness (2/2 s/p L TKA)      Cervical / Trunk Assessment: Normal  Communication   Communication: No difficulties  Cognition Arousal/Alertness: Lethargic Behavior During Therapy: WFL for tasks assessed/performed Overall Cognitive Status: Within Functional Limits for tasks assessed  General Comments General comments (skin integrity, edema, etc.): O2 temporarily discontinued during ambulation and reapplied once seated in chair    Exercises Total Joint Exercises Ankle Circles/Pumps: AROM;20 reps;Both;Seated;Supine Quad Sets: AROM;Left;5 reps;Seated (5 second holds) Heel Slides: AROM;Left;10 reps;Supine Straight Leg Raises: AROM;Left;10 reps;Supine Knee Flexion: AROM;5 reps;Left;Seated (5 second holds) Goniometric ROM:  100 deg F      Assessment/Plan    PT Assessment Patient needs continued PT services  PT Diagnosis Difficulty walking;Abnormality of gait;Generalized weakness;Acute pain   PT Problem List Decreased strength;Decreased range of motion;Decreased activity tolerance;Decreased balance;Decreased mobility;Decreased coordination;Decreased knowledge of use of DME;Decreased safety awareness;Decreased knowledge of precautions;Pain  PT Treatment Interventions DME instruction;Gait training;Stair training;Functional mobility training;Therapeutic activities;Therapeutic exercise;Neuromuscular re-education;Balance training;Patient/family education;Modalities   PT Goals (Current goals can be found in the Care Plan section) Acute Rehab PT Goals Patient Stated Goal: To get stronger PT Goal Formulation: With patient/family Time For Goal Achievement: 02/14/15 Potential to Achieve Goals: Good    Frequency 7X/week   Barriers to discharge        Co-evaluation               End of Session Equipment Utilized During Treatment: Gait belt Activity Tolerance: Patient limited by lethargy (from medication) Patient left: in chair;with call bell/phone within reach;with family/visitor present Nurse Communication: Mobility status;Precautions;Weight bearing status (use of KI w/ ambulation)         Time: 6384-6659 PT Time Calculation (min) (ACUTE ONLY): 28 min   Charges:   PT Evaluation $Initial PT Evaluation Tier I: 1 Procedure PT Treatments $Therapeutic Exercise: 8-22 mins   PT G CodesJoslyn Hy PT, DPT [phone] #[id number] 02/07/2015, 4:06 PM

## 2015-02-07 NOTE — Anesthesia Procedure Notes (Addendum)
Anesthesia Regional Block:  Adductor canal block  Pre-Anesthetic Checklist: ,, timeout performed, Correct Patient, Correct Site, Correct Laterality, Correct Procedure, Correct Position, site marked, Risks and benefits discussed,  Surgical consent,  Pre-op evaluation,  At surgeon's request and post-op pain management  Laterality: Left  Prep: chloraprep and alcohol swabs       Needles:  Injection technique: Single-shot  Needle Type: Stimulator Needle - 80          Additional Needles:  Procedures: Doppler guided Adductor canal block Narrative:  Start time: 02/07/2015 8:25 AM End time: 02/07/2015 8:30 AM Injection made incrementally with aspirations every 5 mL.  Performed by: Personally   Additional Notes: Pt accepts procedure w/ risks. 20cc 0.5% Marcaine w/ epi w/o difficulty or discomfort. GES   Procedure Name: Intubation Date/Time: 02/07/2015 9:15 AM Performed by: Luciana Axe K Pre-anesthesia Checklist: Patient identified, Emergency Drugs available, Suction available, Patient being monitored and Timeout performed Patient Re-evaluated:Patient Re-evaluated prior to inductionOxygen Delivery Method: Circle system utilized Preoxygenation: Pre-oxygenation with 100% oxygen Intubation Type: IV induction Ventilation: Mask ventilation without difficulty and Oral airway inserted - appropriate to patient size Laryngoscope Size: Miller and 2 Grade View: Grade II Tube type: Oral Tube size: 7.0 mm Number of attempts: 1 Airway Equipment and Method: Stylet Placement Confirmation: ETT inserted through vocal cords under direct vision,  positive ETCO2,  CO2 detector and breath sounds checked- equal and bilateral Secured at: 20 cm Tube secured with: Tape Dental Injury: Teeth and Oropharynx as per pre-operative assessment  Comments: Smooth IV induction. EZ mask with oral ariway. LMA #4 placed with ease. +ETCO2, but tidal volumes <100. LMA removed, patient masked. Atraum OI. See  note.

## 2015-02-07 NOTE — Interval H&P Note (Signed)
History and Physical Interval Note:  02/07/2015 9:02 AM  Marni Griffon  has presented today for surgery, with the diagnosis of PRIMARY LOCALIZED OA LEFT KNEE  The various methods of treatment have been discussed with the patient and family. After consideration of risks, benefits and other options for treatment, the patient has consented to  Procedure(s): LEFT TOTAL KNEE ARTHROPLASTY (Left) as a surgical intervention .  The patient's history has been reviewed, patient examined, no change in status, stable for surgery.  I have reviewed the patient's chart and labs.  Questions were answered to the patient's satisfaction.     Elsie Saas A

## 2015-02-07 NOTE — Progress Notes (Signed)
OT Cancellation Note  Patient Details Name: [name] MRN: [id number] DOB: Mar 28, 1956   Cancelled Treatment:    Reason Eval/Treat Not Completed: Other (comment). Pt falling asleep while family member hugging her. Will plan to evaluate tomorrow.  Benito Mccreedy OTR/L [phone] 02/07/2015, 5:04 PM

## 2015-02-07 NOTE — Anesthesia Postprocedure Evaluation (Signed)
Anesthesia Post Note  Patient: [name]  Procedure(s) Performed: Procedure(s) (LRB): LEFT TOTAL KNEE ARTHROPLASTY (Left)  Anesthesia type: general  Patient location: PACU  Post pain: Pain level controlled  Post assessment: Patient's Cardiovascular Status Stable  Last Vitals:  Filed Vitals:   02/07/15 1230  BP:   Pulse: 86  Temp:   Resp: 10    Post vital signs: Reviewed and stable  Level of consciousness: sedated  Complications: No apparent anesthesia complications

## 2015-02-07 NOTE — Transfer of Care (Signed)
Immediate Anesthesia Transfer of Care Note  Patient: [name]  Procedure(s) Performed: Procedure(s): LEFT TOTAL KNEE ARTHROPLASTY (Left)  Patient Location: PACU  Anesthesia Type:General  Level of Consciousness: awake, oriented and patient cooperative  Airway & Oxygen Therapy: Patient Spontanous Breathing and Patient connected to face mask oxygen  Post-op Assessment: Report given to RN and Post -op Vital signs reviewed and stable  Post vital signs: Reviewed  Last Vitals:  Filed Vitals:   02/07/15 0840  BP: 120/61  Pulse: 65  Temp:   Resp: 14    Complications: No apparent anesthesia complications

## 2015-02-07 NOTE — Op Note (Signed)
MRN:     [id number] DOB/AGE:    1956/03/02 / 59 y.o.       OPERATIVE REPORT    DATE OF PROCEDURE:  02/07/2015       PREOPERATIVE DIAGNOSIS:   PRIMARY LOCALIZED OA LEFT KNEE      Estimated body mass index is 35.11 kg/(m^2) as calculated from the following:   Height as of this encounter: 5\' 2"  (1.575 m).   Weight as of this encounter: 87.091 kg (192 lb).                                                        POSTOPERATIVE DIAGNOSIS:   SAME                                                                      PROCEDURE:  Procedure(s): LEFT TOTAL KNEE ARTHROPLASTY Using Depuy Attune RP implants #5 Femur, #5Tibia, 64mm sigma RP bearing, 35 Patella     SURGEON: Lavanda Nevels A    ASSISTANT:  Kirstin Shepperson PA-C   (Present and scrubbed throughout the case, critical for assistance with exposure, retraction, instrumentation, and closure.)         ANESTHESIA: GET with Femoral Nerve Block  DRAINS: foley, 2 medium hemovac in knee   TOURNIQUET TIME: 40JWJ   COMPLICATIONS:  None     SPECIMENS: None   INDICATIONS FOR PROCEDURE: The patient has  DJD LEFT KNEE, varus deformities, XR shows bone on bone arthritis. Patient has failed all conservative measures including anti-inflammatory medicines, narcotics, attempts at  exercise and weight loss, cortisone injections and viscosupplementation.  Risks and benefits of surgery have been discussed, questions answered.   DESCRIPTION OF PROCEDURE: The patient identified by armband, received  right femoral nerve block and IV antibiotics, in the holding area at [facility]. Patient taken to the operating room, appropriate anesthetic  monitors were attached General endotracheal anesthesia induced with  the patient in supine position, Foley catheter was inserted. Tourniquet  applied high to the operative thigh. Lateral post and foot positioner  applied to the table, the lower extremity was then prepped and draped  in usual sterile fashion from the  ankle to the tourniquet. Time-out procedure was performed. The limb was wrapped with an Esmarch bandage and the tourniquet inflated to 365 mmHg. We began the operation by making the anterior midline incision starting at handbreadth above the patella going over the patella 1 cm medial to and  4 cm distal to the tibial tubercle. Small bleeders in the skin and the  subcutaneous tissue identified and cauterized. Transverse retinaculum was incised and reflected medially and a medial parapatellar arthrotomy was accomplished. the patella was everted and theprepatellar fat pad resected. The superficial medial collateral  ligament was then elevated from anterior to posterior along the proximal  flare of the tibia and anterior half of the menisci resected. The knee was hyperflexed exposing bone on bone arthritis. Peripheral and notch osteophytes as well as the cruciate ligaments were then resected. We continued to  work our way around posteriorly along the proximal tibia, and  externally  rotated the tibia subluxing it out from underneath the femur. A McHale  retractor was placed through the notch and a lateral Hohmann retractor  placed, and we then drilled through the proximal tibia in line with the  axis of the tibia followed by an intramedullary guide rod and 2-degree  posterior slope cutting guide. The tibial cutting guide was pinned into place  allowing resection of 4 mm of bone medially and about 6 mm of bone  laterally because of her varus deformity. Satisfied with the tibial resection, we then  entered the distal femur 2 mm anterior to the PCL origin with the  intramedullary guide rod and applied the distal femoral cutting guide  set at 48mm, with 5 degrees of valgus. This was pinned along the  epicondylar axis. At this point, the distal femoral cut was accomplished without difficulty. We then sized for a #5 femoral component and pinned the guide in 3 degrees of external rotation.The chamfer cutting  guide was pinned into place. The anterior, posterior, and chamfer cuts were accomplished without difficulty followed by  the Sigma RP box cutting guide and the box cut. We also removed posterior osteophytes from the posterior femoral condyles. At this  time, the knee was brought into full extension. We checked our  extension and flexion gaps and found them symmetric at 18mm.  The patella thickness measured at 24 mm. We set the cutting guide at 7 and removed the posterior 7.5 mm  of the patella sized for 35 button and drilled the lollipop. The knee  was then once again hyperflexed exposing the proximal tibia. We sized for a #5 tibial base plate, applied the smokestack and the conical reamer followed by the the Delta fin keel punch. We then hammered into place the Sigma RP trial femoral component, inserted a 7-mm trial bearing, trial patellar button, and took the knee through range of motion from 0-130 degrees. No thumb pressure was required for patellar  tracking. At this point, all trial components were removed, a double batch of DePuy HV cement  was mixed and applied to all bony metallic mating surfaces except for the posterior condyles of the femur itself. In order, we  hammered into place the tibial tray and removed excess cement, the femoral component and removed excess cement, a 7-mm Sigma RP bearing  was inserted, and the knee brought to full extension with compression.  The patellar button was clamped into place, and excess cement  removed. While the cement cured the wound was irrigated out with normal saline solution pulse lavage, and medium Hemovac drains were placed.. Ligament stability and patellar tracking were checked and found to be excellent. The tourniquet was then released and hemostasis was obtained with cautery. The parapatellar arthrotomy was closed with  #1 ethibond suture. The subcutaneous tissue with 0 and 2-0 undyed  Vicryl suture, and 4-0 Monocryl.. A dressing of Xeroform,  4 x  4, dressing sponges, Webril, and Ace wrap applied. Needle and sponge count were correct times 2.The patient awakened, extubated, and taken to recovery room without difficulty. Vascular status was normal, pulses 2+ and symmetric.   [name] A 02/07/2015, 10:53 AM

## 2015-02-07 NOTE — Anesthesia Preprocedure Evaluation (Addendum)
Anesthesia Evaluation  Patient identified by MRN, date of birth, ID band Patient awake    Reviewed: Allergy & Precautions, NPO status , Patient's Chart, lab work & pertinent test results  History of Anesthesia Complications (+) PONV and history of anesthetic complications  Airway Mallampati: II  TM Distance: >3 FB Neck ROM: Full    Dental  (+) Partial Upper, Dental Advisory Given   Pulmonary sleep apnea , Current Smoker,  breath sounds clear to auscultation        Cardiovascular hypertension, Pt. on medications Rhythm:Regular Rate:Normal     Neuro/Psych PSYCHIATRIC DISORDERS Anxiety Depression negative neurological ROS     GI/Hepatic Neg liver ROS, GERD-  Medicated and Controlled,  Endo/Other  Morbid obesity  Renal/GU negative Renal ROS     Musculoskeletal   Abdominal   Peds  Hematology   Anesthesia Other Findings   Reproductive/Obstetrics negative OB ROS                            Anesthesia Physical Anesthesia Plan  ASA: II  Anesthesia Plan: General and Spinal   Post-op Pain Management:    Induction: Intravenous  Airway Management Planned:   Additional Equipment:   Intra-op Plan:   Post-operative Plan:   Informed Consent: I have reviewed the patients History and Physical, chart, labs and discussed the procedure including the risks, benefits and alternatives for the proposed anesthesia with the patient or authorized representative who has indicated his/her understanding and acceptance.     Plan Discussed with: CRNA, Anesthesiologist and Surgeon  Anesthesia Plan Comments:         Anesthesia Quick Evaluation

## 2015-02-08 ENCOUNTER — Encounter (HOSPITAL_COMMUNITY): Payer: Self-pay | Admitting: Orthopedic Surgery

## 2015-02-08 LAB — CBC
HCT: 34.5 % — ABNORMAL LOW (ref 36.0–46.0)
Hemoglobin: 11.6 g/dL — ABNORMAL LOW (ref 12.0–15.0)
MCH: 29.3 pg (ref 26.0–34.0)
MCHC: 33.6 g/dL (ref 30.0–36.0)
MCV: 87.1 fL (ref 78.0–100.0)
Platelets: 267 10*3/uL (ref 150–400)
RBC: 3.96 MIL/uL (ref 3.87–5.11)
RDW: 14.4 % (ref 11.5–15.5)
WBC: 13.3 10*3/uL — AB (ref 4.0–10.5)

## 2015-02-08 LAB — BASIC METABOLIC PANEL
Anion gap: 8 (ref 5–15)
BUN: 15 mg/dL (ref 6–23)
CO2: 28 mmol/L (ref 19–32)
Calcium: 9.4 mg/dL (ref 8.4–10.5)
Chloride: 101 mmol/L (ref 96–112)
Creatinine, Ser: 0.9 mg/dL (ref 0.50–1.10)
GFR calc Af Amer: 80 mL/min — ABNORMAL LOW (ref >90)
GFR, EST NON AFRICAN AMERICAN: 69 mL/min — AB (ref >90)
Glucose, Bld: 190 mg/dL — ABNORMAL HIGH (ref 70–99)
Potassium: 3.7 mmol/L (ref 3.5–5.1)
Sodium: 137 mmol/L (ref 135–145)

## 2015-02-08 MED ORDER — ACETAMINOPHEN 325 MG PO TABS: 650.0000 mg | ORAL_TABLET | Freq: Four times a day (QID) | ORAL | Status: DC | PRN

## 2015-02-08 MED ORDER — DOCUSATE SODIUM 100 MG PO CAPS: ORAL_CAPSULE | ORAL | Status: DC

## 2015-02-08 MED ORDER — HYDROMORPHONE HCL 2 MG PO TABS: ORAL_TABLET | ORAL | Status: DC

## 2015-02-08 MED ORDER — ASPIRIN EC 325 MG PO TBEC: DELAYED_RELEASE_TABLET | ORAL | Status: DC

## 2015-02-08 MED ORDER — POLYETHYLENE GLYCOL 3350 17 G PO PACK: 17.0000 g | PACK | Freq: Two times a day (BID) | ORAL | Status: DC

## 2015-02-08 NOTE — Progress Notes (Signed)
Physical Therapy Treatment Patient Details Name: [name] MRN: [id number] DOB: 06-30-1956 Today's Date: 02/08/2015    History of Present Illness Pt is a 59 y/o F s/p L TKA on 02/07/15 w/ PMH of L knee menisectomy, anxiety, and L carpal tunnel release.    PT Comments    Pt demonstrated ability to ascend/descend 1 stair w/o railings and tolerated ambulatory activity and therapeutic exercises well.  Pt requires min verbal cues to slow down while ambulating.  Pt is anticipating d/c today.  Follow Up Recommendations  Home health PT;Supervision/Assistance - 24 hour     Equipment Recommendations  None recommended by PT    Recommendations for Other Services       Precautions / Restrictions Precautions Precautions: Fall;Knee Precaution Booklet Issued: Yes (comment) Precaution Comments: Reviewed knee precautions and importance of no pillow under knee Required Braces or Orthoses: Knee Immobilizer - Left Knee Immobilizer - Left: Other (comment) (discontinued per orders in chart) Restrictions Weight Bearing Restrictions: Yes LLE Weight Bearing: Weight bearing as tolerated    Mobility  Bed Mobility               General bed mobility comments: Patient seated in recliner upon OT entering room, please see PT eval for more information  Transfers Overall transfer level: Needs assistance Equipment used: Rolling walker (2 wheeled) Transfers: Sit to/from Stand Sit to Stand: Supervision            Ambulation/Gait Ambulation/Gait assistance: Supervision Ambulation Distance (Feet): 30 Feet Assistive device: Rolling walker (2 wheeled) Gait Pattern/deviations: Step-through pattern;Decreased stance time - left;Decreased stride length;Antalgic Gait velocity: decreased Gait velocity interpretation: Below normal speed for age/gender General Gait Details: verbal cues to slow down and keep walker close to body   Stairs Stairs: Yes Stairs assistance: Supervision Stair  Management: No rails;Forwards;Step to pattern;With walker Number of Stairs: 1    Wheelchair Mobility    Modified Rankin (Stroke Patients Only)       Balance Overall balance assessment: Needs assistance Sitting-balance support: No upper extremity supported Sitting balance-Leahy Scale: Good     Standing balance support: No upper extremity supported Standing balance-Leahy Scale: Fair                      Cognition Arousal/Alertness: Awake/alert Behavior During Therapy: WFL for tasks assessed/performed Overall Cognitive Status: Within Functional Limits for tasks assessed                      Exercises Total Joint Exercises Ankle Circles/Pumps: AROM;20 reps;Both;Seated Towel Squeeze: AROM;Left;10 reps;Seated Heel Slides: AROM;Left;10 reps;Seated Straight Leg Raises: AROM;Left;10 reps;Seated Knee Flexion: AROM;Left;5 reps;Seated Goniometric ROM: 10-89    General Comments        Pertinent Vitals/Pain Pain Assessment: 0-10 Pain Score: 1  Pain Location: L knee Pain Descriptors / Indicators: Aching;Discomfort Pain Intervention(s): Limited activity within patient's tolerance;Monitored during session;Repositioned    Home Living Family/patient expects to be discharged to:: Private residence Living Arrangements: Spouse/significant other Available Help at Discharge: Available 24 hours/day;Family (husband is taking a week off from work) Type of Home: House Home Access: Stairs to enter Entrance Stairs-Rails: None Home Layout: One level Home Equipment: Environmental consultant - 2 wheels;Bedside commode      Prior Function Level of Independence: Independent          PT Goals (current goals can now be found in the care plan section) Acute Rehab PT Goals Patient Stated Goal: go home today! Progress towards PT goals: Progressing  toward goals    Frequency  7X/week    PT Plan Current plan remains appropriate    Co-evaluation             End of Session    Activity Tolerance: Patient tolerated treatment well Patient left: in chair;with call bell/phone within reach;with family/visitor present     Time: 1036-1050 PT Time Calculation (min) (ACUTE ONLY): 14 min  Charges:  $Gait Training: 8-22 mins                    G CodesJoslyn Hy PT, [place] [phone] 02/08/2015, 12:38 PM

## 2015-02-08 NOTE — Progress Notes (Signed)
CARE MANAGEMENT NOTE 02/08/2015  Patient:  [name], [name]   Account Number:  [id number]  Date Initiated:  02/08/2015  Documentation initiated by:  [facility]  Subjective/Objective Assessment:   s/p left TKA     Action/Plan:   PT/OT evals- recommended HHPT   Anticipated DC Date:  02/08/2015   Anticipated DC Plan:  [place] [facility] Services  CM consult      [facility] Choice  [place]   Choice offered to / List presented to:  C-1 Patient   DME arranged  3-N-1  CPM      DME agency  [employer]     [place] arranged  HH-2 PT      [place].   Status of service:  Completed, signed off  Discharge Disposition:  [place]  Per UR Regulation:  Reviewed for med. necessity/level of care/duration of stay   Comments:  02/08/15 Spoke with patient about HHC, she selected [facility]. Contacted Miranda at [facility] and set up [place]. Spoke with Ruby Cola at [employer], they are providing CPM and 3N1, patient already has a rolling walker, confirmed with patient.

## 2015-02-08 NOTE — Evaluation (Signed)
  Occupational Therapy Evaluation Patient Details Name: [name] MRN: [id number] DOB: 1956/05/08 Today's Date: 02/08/2015    History of Present Illness Pt is a 59 y/o F s/p L TKA on 02/07/15 w/ PMH of L knee menisectomy, anxiety, and L carpal tunnel release.   Clinical Impression   Patient admitted with above. Patient independent PTA. Patient currently functioning at an overall mod I level. D/C from acute OT services and no additional follow-up OT needs at this time. All appropriate education provided to patient. Please re-order OT as needed.      Follow Up Recommendations  No OT follow up;Supervision - Intermittent    Equipment Recommendations  None recommended by OT    Recommendations for Other Services  None at this time     Precautions / Restrictions Precautions Precautions: Fall;Knee Precaution Comments: Reviewed knee precautions and importance of no pillow under knee Required Braces or Orthoses: Knee Immobilizer - Left Knee Immobilizer - Left: Other (comment) (discontinued per orders in chart) Restrictions Weight Bearing Restrictions: Yes LLE Weight Bearing: Weight bearing as tolerated      Mobility Bed Mobility General bed mobility comments: Patient seated in recliner upon OT entering room, please see PT eval for more information  Transfers Overall transfer level: Modified independent    Balance Overall balance assessment: No apparent balance deficits (not formally assessed)    ADL Overall ADL's : Modified independent   General ADL Comments: Patient able to reach BLEs for LB ADLs. Patient overall mod I using RW for functional mobility and transfers. Patient with safe behaviors and safely able to go home without [place].     Pertinent Vitals/Pain Pain Assessment: No/denies pain     Hand Dominance Right   Extremity/Trunk Assessment Upper Extremity Assessment Upper Extremity Assessment: Overall WFL for tasks assessed   Lower Extremity Assessment Lower  Extremity Assessment: Defer to PT evaluation   Cervical / Trunk Assessment Cervical / Trunk Assessment: Normal   Communication Communication Communication: No difficulties   Cognition Arousal/Alertness: Awake/alert Behavior During Therapy: WFL for tasks assessed/performed Overall Cognitive Status: Within Functional Limits for tasks assessed              Home Living Family/patient expects to be discharged to:: Private residence Living Arrangements: Spouse/significant other Available Help at Discharge: Available 24 hours/day;Family (husband is taking a week off from work) Type of Home: House Home Access: Stairs to enter Technical brewer of Steps: 1 Entrance Stairs-Rails: None Home Layout: One level     Bathroom Shower/Tub: Walk-in shower;Door   [employer] Toilet: Standard     Home Equipment: Environmental consultant - 2 wheels;Bedside commode          Prior Functioning/Environment Level of Independence: Independent      Patient Stated Goal: go home today!     Barriers to D/C:  None at this time          End of Session Equipment Utilized During Treatment: Rolling walker CPM Left Knee CPM Left Knee: Off Nurse Communication: Other (comment) (increased blood > left knee)  Activity Tolerance: Patient tolerated treatment well Patient left: in chair;with call bell/phone within reach   Time: 1002-1025 OT Time Calculation (min): 23 min Charges:  OT General Charges $OT Visit: 1 Procedure OT Evaluation $Initial OT Evaluation Tier I: 1 Procedure OT Treatments $Self Care/Home Management : 8-22 mins  Bernis Stecher , MS, OTR/L, CLT Pager: [phone]  02/08/2015, 10:35 AM

## 2015-02-08 NOTE — Discharge Summary (Signed)
Patient ID: [name] MRN: [id number] DOB/AGE: 03/30/1956 59 y.o.  Admit date: 02/07/2015 Discharge date: 02/08/2015  Admission Diagnoses:  Principal Problem:   Primary localized osteoarthritis of left knee Active Problems:   PONV (postoperative nausea and vomiting)   Hypertension   Urinary incontinence   Anxiety   Depression   GERD (gastroesophageal reflux disease)   Sleep apnea   Claustrophobia   Deafness   DJD (degenerative joint disease) of knee   Discharge Diagnoses:  Same  Past Medical History  Diagnosis Date  . PONV (postoperative nausea and vomiting)   . Hypertension   . Urinary incontinence     has increased urination at bedtime; pt wears pads at bedtime, but still wets the bed  . Anxiety   . Depression   . GERD (gastroesophageal reflux disease)   . Deafness     right ear  . Claustrophobia   . Sleep apnea     moderate; does not wear CPAP   . Primary localized osteoarthritis of left knee     Surgeries: Procedure(s): LEFT TOTAL KNEE ARTHROPLASTY on 02/07/2015   Consultants:    Discharged Condition: Improved  Hospital Course: [name] is an 59 y.o. female who was admitted 02/07/2015 for operative treatment ofPrimary localized osteoarthritis of left knee. Patient has severe unremitting pain that affects sleep, daily activities, and work/hobbies. After pre-op clearance the patient was taken to the operating room on 02/07/2015 and underwent  Procedure(s): LEFT TOTAL KNEE ARTHROPLASTY.    Patient was given perioperative antibiotics: Anti-infectives    Start     Dose/Rate Route Frequency Ordered Stop   02/07/15 1500  clindamycin (CLEOCIN) IVPB 600 mg     600 mg 100 mL/hr over 30 Minutes Intravenous Every 6 hours 02/07/15 1312 02/07/15 2202   02/07/15 0600  vancomycin (VANCOCIN) IVPB 1000 mg/200 mL premix     1,000 mg 200 mL/hr over 60 Minutes Intravenous On call to O.R. 02/06/15 1426 02/07/15 0925   02/07/15 0600  ceFAZolin (ANCEF) IVPB 2 g/50 mL  premix  Status:  Discontinued     2 g 100 mL/hr over 30 Minutes Intravenous On call to O.R. 02/06/15 1426 02/07/15 1258       Patient was given sequential compression devices, early ambulation, and chemoprophylaxis to prevent DVT.  Patient benefited maximally from hospital stay and there were no complications.    Recent vital signs: Patient Vitals for the past 24 hrs:  BP Temp Pulse Resp SpO2  02/08/15 0500 135/60 mmHg 97.8 F (36.6 C) 62 16 95 %  02/08/15 0103 (!) 142/68 mmHg 97.5 F (36.4 C) (!) 53 16 95 %  02/07/15 2100 (!) 107/48 mmHg 98 F (36.7 C) 67 14 97 %  02/07/15 1245 116/69 mmHg 97.7 F (36.5 C) 93 12 95 %  02/07/15 1230 - - 86 10 92 %  02/07/15 1215 - - 81 11 97 %  02/07/15 1206 136/64 mmHg - 88 14 99 %  02/07/15 1200 - - 81 15 98 %  02/07/15 1150 139/80 mmHg 97.7 F (36.5 C) 92 16 98 %  02/07/15 0840 120/61 mmHg - 65 14 100 %  02/07/15 0835 120/61 mmHg - 61 14 100 %     Recent laboratory studies:  Recent Labs  02/07/15 1459 02/08/15 0559  WBC 12.5* 13.3*  HGB 13.4 11.6*  HCT 39.9 34.5*  PLT 252 267  NA  --  137  K  --  3.7  CL  --  101  CO2  --  28  BUN  --  15  CREATININE 0.96 0.90  GLUCOSE  --  190*  CALCIUM  --  9.4     Discharge Medications:     Medication List    STOP taking these medications        ciprofloxacin 500 MG tablet  Commonly known as:  CIPRO      TAKE these medications        acetaminophen 325 MG tablet  Commonly known as:  TYLENOL  Take 2 tablets (650 mg total) by mouth every 6 (six) hours as needed for mild pain (or Fever >/= 101).     ALPRAZolam 0.5 MG tablet  Commonly known as:  XANAX  Take 0.5 mg by mouth 3 (three) times daily as needed for anxiety.     amLODipine-benazepril 5-20 MG per capsule  Commonly known as:  LOTREL  Take 1 capsule by mouth daily.     aspirin EC 325 MG tablet  1 tab a day for the next 30 days to prevent blood clots     docusate sodium 100 MG capsule  Commonly known as:  COLACE  1  tab 2 times a day while on narcotics.  STOOL SOFTENER     ezetimibe 10 MG tablet  Commonly known as:  ZETIA  Take 10 mg by mouth daily.     HYDROmorphone 2 MG tablet  Commonly known as:  DILAUDID  1-2 tablets every 4-6 hrs as needed for pain     lansoprazole 30 MG capsule  Commonly known as:  PREVACID  Take 30 mg by mouth daily at 12 noon.     polyethylene glycol packet  Commonly known as:  MIRALAX / GLYCOLAX  Take 17 g by mouth 2 (two) times daily.     valsartan-hydrochlorothiazide 320-12.5 MG per tablet  Commonly known as:  DIOVAN-HCT  Take 1 tablet by mouth daily.     venlafaxine XR 150 MG 24 hr capsule  Commonly known as:  EFFEXOR-XR  Take 150 mg by mouth daily with breakfast.        Diagnostic Studies: Dg Chest 2 View  02/07/2015   CLINICAL DATA:  Hypertension. Sleep apnea. Tobacco use. Preoperative assessment for left knee replacement.  EXAM: CHEST  2 VIEW  COMPARISON:  04/04/2005  FINDINGS: The heart size and mediastinal contours are within normal limits. Both lungs are clear. The visualized skeletal structures are unremarkable.  IMPRESSION: No active cardiopulmonary disease.   Electronically Signed   By: Van Clines M.D.   On: 02/07/2015 07:39    Disposition:       Discharge Instructions    CPM    Complete by:  As directed   Continuous passive motion machine (CPM):      Use the CPM from 0 to 90 for 6 hours per day.       You may break it up into 2 or 3 sessions per day.      Use CPM for 2 weeks or until you are told to stop.     Call MD / Call 911    Complete by:  As directed   If you experience chest pain or shortness of breath, CALL 911 and be transported to the hospital emergency room.  If you develope a fever above 101 F, pus (white drainage) or increased drainage or redness at the wound, or calf pain, call your surgeon's office.     Change dressing    Complete by:  As directed   Keep  bandage over wound.  Wash whole leg including over the bandage  every day with soap and water.     Constipation Prevention    Complete by:  As directed   Drink plenty of fluids.  Prune juice may be helpful.  You may use a stool softener, such as Colace (over the counter) 100 mg twice a day.  Use MiraLax (over the counter) for constipation as needed.     Diet - low sodium heart healthy    Complete by:  As directed      Discharge instructions    Complete by:  As directed   Total Knee Replacement Care After Refer to this sheet in the next few weeks. These discharge instructions provide you with general information on caring for yourself after you leave the hospital. Your caregiver may also give you specific instructions. Your treatment has been planned according to the most current medical practices available, but unavoidable complications sometimes occur. If you have any problems or questions after discharge, please call your caregiver. Regaining a near full range of motion of your knee within the first 3 to 6 weeks after surgery is critical. [place] may resume a normal diet and activities as directed.  Perform exercises as directed.  Place gray foam block, curve side up under heel at all times except when in CPM or when walking.  DO NOT modify, tear, cut, or change in any way the gray foam block. You will receive physical therapy daily  Take showers instead of baths until informed otherwise.  You may shower on Friday.  Please wash whole leg including wound with soap and water over aquacel dressing.  DO NOT REMOVE AQUACEL DRESSING.  DR Noemi Chapel WILL TAKE IT OFF IN THE OFFICE AT THE POST OP APPOINTMENT  It is OK to take over-the-counter tylenol in addition to the oxycodone for pain, discomfort, or fever. Oxycodone is VERY constipating.  Please take stool softener twice a day and laxatives daily until bowels are regular Eat a well-balanced diet.  Avoid lifting or driving until you are instructed otherwise.  Make an appointment to see your  caregiver for stitches (suture) or staple removal as directed.  If you have been sent home with a continuous passive motion machine (CPM machine), 0-90 degrees 6 hrs a day   2 hrs a shift SEEK MEDICAL CARE IF: You have swelling of your calf or leg.  You develop shortness of breath or chest pain.  You have redness, swelling, or increasing pain in the wound.  There is pus or any unusual drainage coming from the surgical site.  You notice a bad smell coming from the surgical site or dressing.  The surgical site breaks open after sutures or staples have been removed.  There is persistent bleeding from the suture or staple line.  You are getting worse or are not improving.  You have any other questions or concerns.  SEEK IMMEDIATE MEDICAL CARE IF:  You have a fever.  You develop a rash.  You have difficulty breathing.  You develop any reaction or side effects to medicines given.  Your knee motion is decreasing rather than improving.  MAKE SURE YOU:  Understand these instructions.  Will watch your condition.  Will get help right away if you are not doing well or get worse.     Do not put a pillow under the knee. Place it under the heel.    Complete by:  As directed   Place gray  foam block, curve side up under heel at all times except when in CPM or when walking.  DO NOT modify, tear, cut, or change in any way the gray foam block.     Increase activity slowly as tolerated    Complete by:  As directed      TED hose    Complete by:  As directed   Use stockings (TED hose) for 2 weeks on both leg(s).  You may remove them at night for sleeping.           Follow-up Information    Follow up with Lorn Junes, MD On 02/21/2015.   Specialty:  Orthopedic Surgery   Why:  appointment time 2:30 pm   Contact information:   [address] [place] [address] [phone]        Signed: Linda Hedges 02/08/2015, 8:30 AM

## 2015-12-25 IMAGING — CR DG CHEST 2V
2 series · 2 of 2 positions shown · non-contrast
Comparison: 04/04/2005

CLINICAL DATA: Hypertension. Sleep apnea. Tobacco use. Preoperative
assessment for left knee replacement.

EXAM:
CHEST  2 VIEW

[w chest pa]
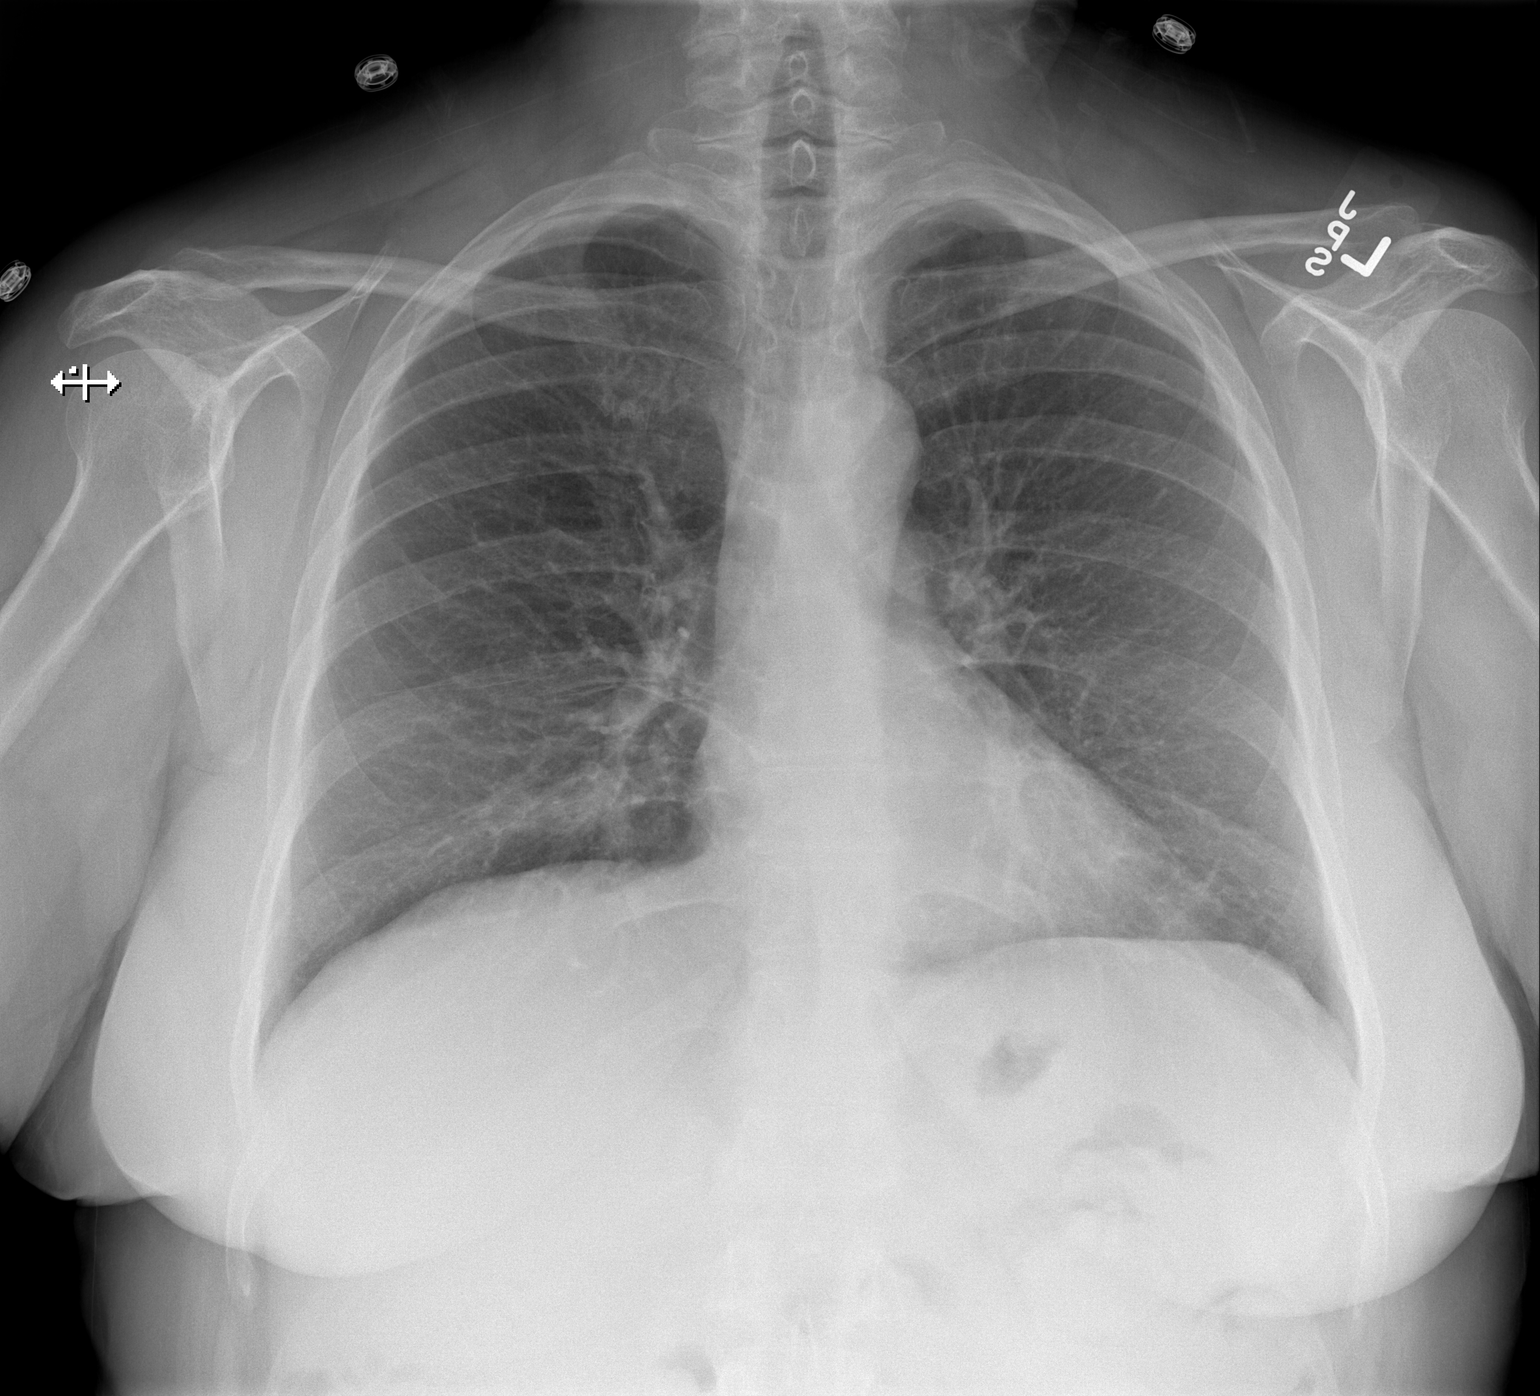

[w chest lat]
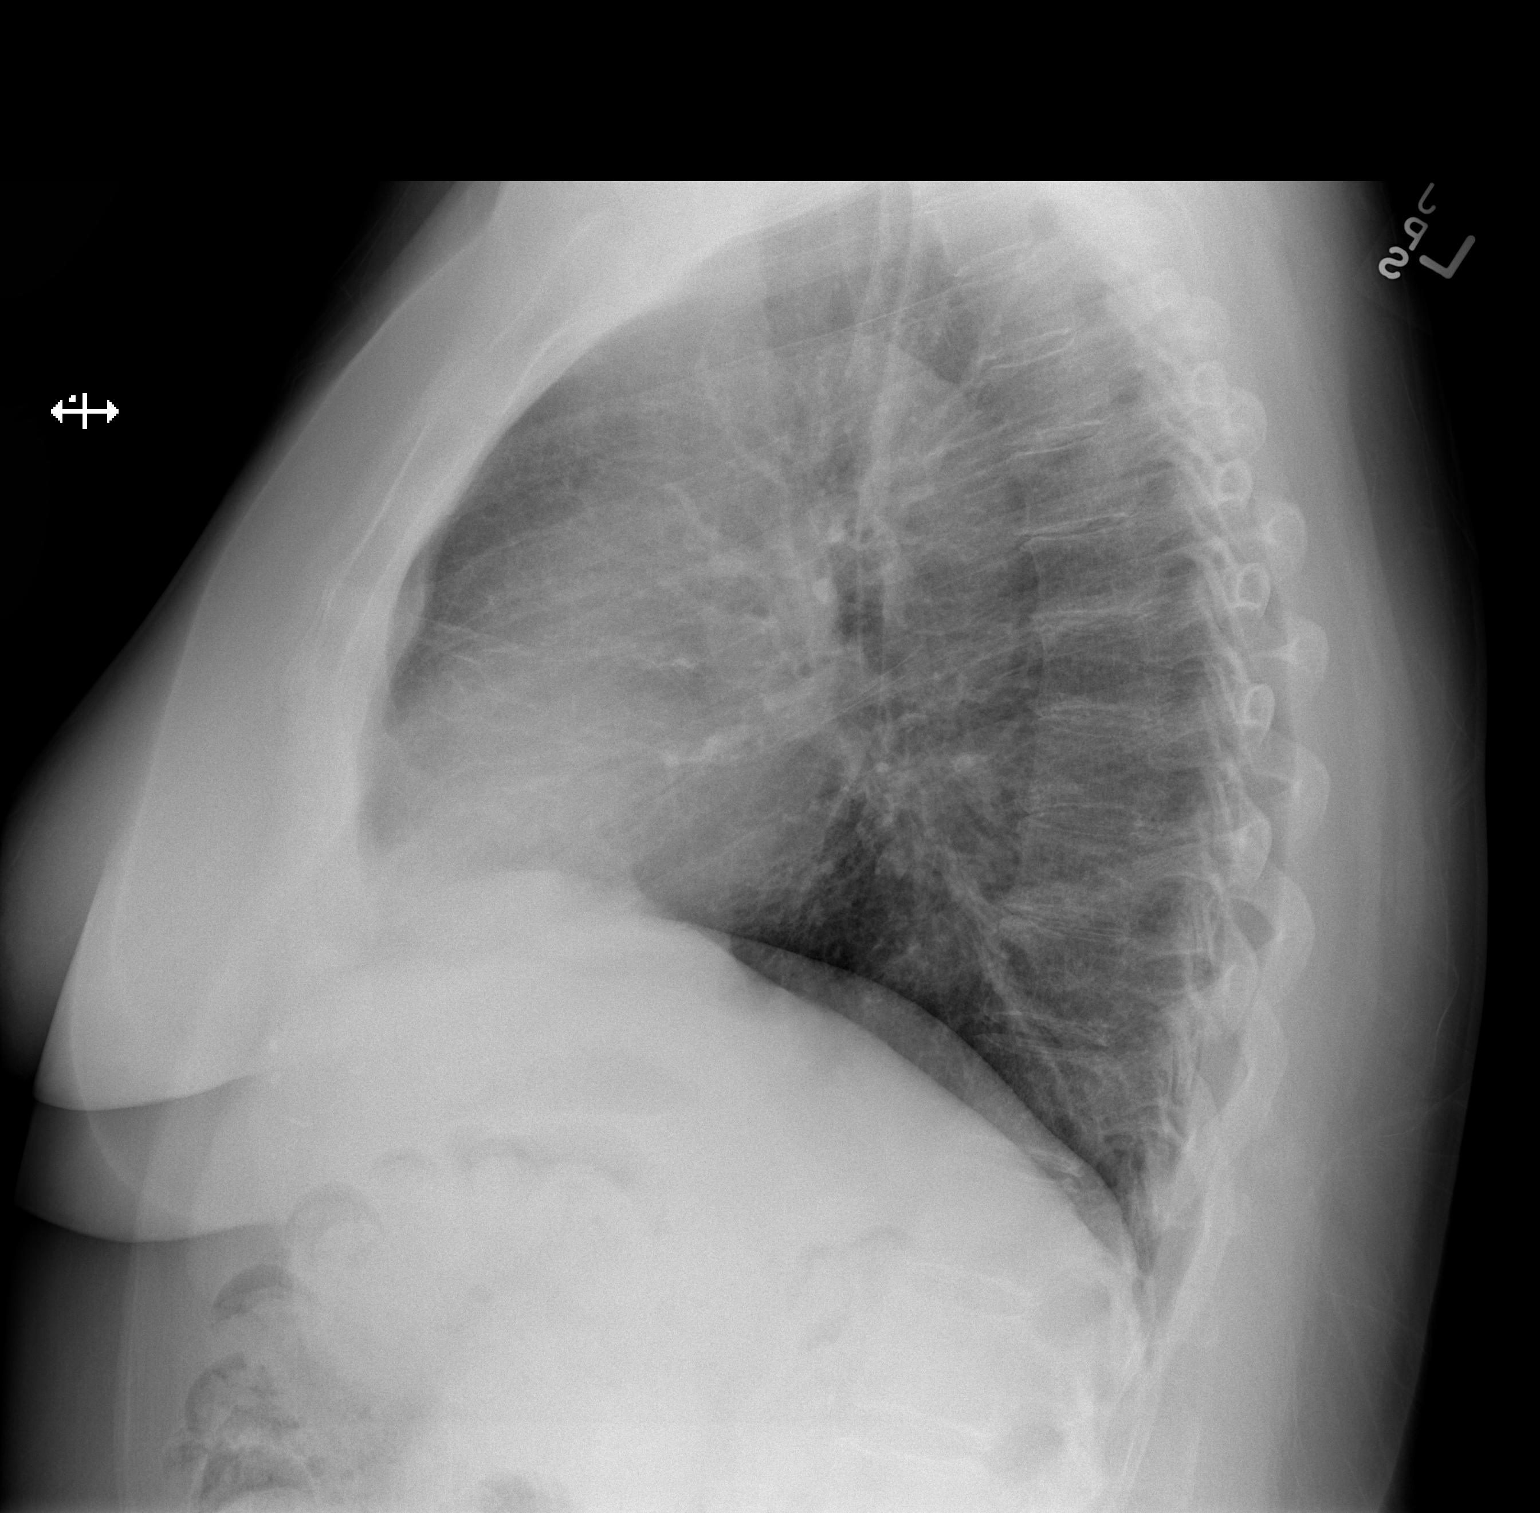

[2 of 2 positions shown; findings below may reference images not displayed]

FINDINGS: The heart size and mediastinal contours are within normal limits.
Both lungs are clear. The visualized skeletal structures are
unremarkable.
IMPRESSION: No active cardiopulmonary disease.

## 2016-02-21 DIAGNOSIS — Z6837 Body mass index (BMI) 37.0-37.9, adult: Secondary | ICD-10-CM | POA: Diagnosis not present

## 2016-02-21 DIAGNOSIS — Z1231 Encounter for screening mammogram for malignant neoplasm of breast: Secondary | ICD-10-CM | POA: Diagnosis not present

## 2016-02-21 DIAGNOSIS — Z1389 Encounter for screening for other disorder: Secondary | ICD-10-CM | POA: Diagnosis not present

## 2016-02-21 DIAGNOSIS — E785 Hyperlipidemia, unspecified: Secondary | ICD-10-CM | POA: Diagnosis not present

## 2016-02-21 DIAGNOSIS — K219 Gastro-esophageal reflux disease without esophagitis: Secondary | ICD-10-CM | POA: Diagnosis not present

## 2016-02-21 DIAGNOSIS — F329 Major depressive disorder, single episode, unspecified: Secondary | ICD-10-CM | POA: Diagnosis not present

## 2016-02-21 DIAGNOSIS — R7301 Impaired fasting glucose: Secondary | ICD-10-CM | POA: Diagnosis not present

## 2016-02-21 DIAGNOSIS — R531 Weakness: Secondary | ICD-10-CM | POA: Diagnosis not present

## 2016-02-21 DIAGNOSIS — F419 Anxiety disorder, unspecified: Secondary | ICD-10-CM | POA: Diagnosis not present

## 2016-02-21 DIAGNOSIS — I10 Essential (primary) hypertension: Secondary | ICD-10-CM | POA: Diagnosis not present

## 2016-08-13 DIAGNOSIS — M545 Low back pain: Secondary | ICD-10-CM | POA: Diagnosis not present

## 2016-08-13 DIAGNOSIS — E669 Obesity, unspecified: Secondary | ICD-10-CM | POA: Diagnosis not present

## 2016-08-13 DIAGNOSIS — Z6837 Body mass index (BMI) 37.0-37.9, adult: Secondary | ICD-10-CM | POA: Diagnosis not present

## 2016-08-13 DIAGNOSIS — M5416 Radiculopathy, lumbar region: Secondary | ICD-10-CM | POA: Diagnosis not present

## 2016-08-15 DIAGNOSIS — R209 Unspecified disturbances of skin sensation: Secondary | ICD-10-CM | POA: Diagnosis not present

## 2016-08-15 DIAGNOSIS — M25552 Pain in left hip: Secondary | ICD-10-CM | POA: Diagnosis not present

## 2017-04-06 DIAGNOSIS — J069 Acute upper respiratory infection, unspecified: Secondary | ICD-10-CM | POA: Diagnosis not present

## 2017-04-06 DIAGNOSIS — K219 Gastro-esophageal reflux disease without esophagitis: Secondary | ICD-10-CM | POA: Diagnosis not present

## 2017-04-06 DIAGNOSIS — E785 Hyperlipidemia, unspecified: Secondary | ICD-10-CM | POA: Diagnosis not present

## 2017-04-06 DIAGNOSIS — M8589 Other specified disorders of bone density and structure, multiple sites: Secondary | ICD-10-CM | POA: Diagnosis not present

## 2017-04-06 DIAGNOSIS — I10 Essential (primary) hypertension: Secondary | ICD-10-CM | POA: Diagnosis not present

## 2017-04-06 DIAGNOSIS — F419 Anxiety disorder, unspecified: Secondary | ICD-10-CM | POA: Diagnosis not present

## 2017-04-06 DIAGNOSIS — F329 Major depressive disorder, single episode, unspecified: Secondary | ICD-10-CM | POA: Diagnosis not present

## 2017-04-06 DIAGNOSIS — R7301 Impaired fasting glucose: Secondary | ICD-10-CM | POA: Diagnosis not present

## 2017-04-06 DIAGNOSIS — Z1231 Encounter for screening mammogram for malignant neoplasm of breast: Secondary | ICD-10-CM | POA: Diagnosis not present

## 2017-07-09 DIAGNOSIS — K219 Gastro-esophageal reflux disease without esophagitis: Secondary | ICD-10-CM | POA: Diagnosis not present

## 2017-07-09 DIAGNOSIS — R7301 Impaired fasting glucose: Secondary | ICD-10-CM | POA: Diagnosis not present

## 2017-07-09 DIAGNOSIS — Z6836 Body mass index (BMI) 36.0-36.9, adult: Secondary | ICD-10-CM | POA: Diagnosis not present

## 2017-07-09 DIAGNOSIS — R42 Dizziness and giddiness: Secondary | ICD-10-CM | POA: Diagnosis not present

## 2017-07-09 DIAGNOSIS — E785 Hyperlipidemia, unspecified: Secondary | ICD-10-CM | POA: Diagnosis not present

## 2017-07-09 DIAGNOSIS — Z1389 Encounter for screening for other disorder: Secondary | ICD-10-CM | POA: Diagnosis not present

## 2017-07-09 DIAGNOSIS — F419 Anxiety disorder, unspecified: Secondary | ICD-10-CM | POA: Diagnosis not present

## 2017-07-09 DIAGNOSIS — I10 Essential (primary) hypertension: Secondary | ICD-10-CM | POA: Diagnosis not present

## 2017-07-09 DIAGNOSIS — Z1231 Encounter for screening mammogram for malignant neoplasm of breast: Secondary | ICD-10-CM | POA: Diagnosis not present

## 2017-07-09 DIAGNOSIS — F17200 Nicotine dependence, unspecified, uncomplicated: Secondary | ICD-10-CM | POA: Diagnosis not present

## 2017-07-09 DIAGNOSIS — F329 Major depressive disorder, single episode, unspecified: Secondary | ICD-10-CM | POA: Diagnosis not present

## 2017-07-09 DIAGNOSIS — J449 Chronic obstructive pulmonary disease, unspecified: Secondary | ICD-10-CM | POA: Diagnosis not present

## 2017-11-14 DIAGNOSIS — I10 Essential (primary) hypertension: Secondary | ICD-10-CM | POA: Diagnosis not present

## 2017-11-14 DIAGNOSIS — F419 Anxiety disorder, unspecified: Secondary | ICD-10-CM | POA: Diagnosis not present

## 2018-02-05 DIAGNOSIS — L570 Actinic keratosis: Secondary | ICD-10-CM | POA: Diagnosis not present

## 2018-02-05 DIAGNOSIS — D1801 Hemangioma of skin and subcutaneous tissue: Secondary | ICD-10-CM | POA: Diagnosis not present

## 2018-02-05 DIAGNOSIS — D492 Neoplasm of unspecified behavior of bone, soft tissue, and skin: Secondary | ICD-10-CM | POA: Diagnosis not present

## 2018-02-05 DIAGNOSIS — L304 Erythema intertrigo: Secondary | ICD-10-CM | POA: Diagnosis not present

## 2018-12-08 DIAGNOSIS — M9905 Segmental and somatic dysfunction of pelvic region: Secondary | ICD-10-CM | POA: Diagnosis not present

## 2018-12-08 DIAGNOSIS — M9902 Segmental and somatic dysfunction of thoracic region: Secondary | ICD-10-CM | POA: Diagnosis not present

## 2018-12-08 DIAGNOSIS — M9903 Segmental and somatic dysfunction of lumbar region: Secondary | ICD-10-CM | POA: Diagnosis not present

## 2018-12-08 DIAGNOSIS — M545 Low back pain: Secondary | ICD-10-CM | POA: Diagnosis not present

## 2022-03-01 DIAGNOSIS — F419 Anxiety disorder, unspecified: Secondary | ICD-10-CM | POA: Diagnosis not present

## 2022-03-01 DIAGNOSIS — K219 Gastro-esophageal reflux disease without esophagitis: Secondary | ICD-10-CM | POA: Diagnosis not present

## 2022-03-01 DIAGNOSIS — J449 Chronic obstructive pulmonary disease, unspecified: Secondary | ICD-10-CM | POA: Diagnosis not present

## 2022-03-01 DIAGNOSIS — F332 Major depressive disorder, recurrent severe without psychotic features: Secondary | ICD-10-CM | POA: Diagnosis not present

## 2022-03-01 DIAGNOSIS — I10 Essential (primary) hypertension: Secondary | ICD-10-CM | POA: Diagnosis not present

## 2022-03-01 DIAGNOSIS — R69 Illness, unspecified: Secondary | ICD-10-CM | POA: Diagnosis not present

## 2022-03-01 DIAGNOSIS — E785 Hyperlipidemia, unspecified: Secondary | ICD-10-CM | POA: Diagnosis not present

## 2022-04-04 DIAGNOSIS — J208 Acute bronchitis due to other specified organisms: Secondary | ICD-10-CM | POA: Diagnosis not present

## 2022-04-04 DIAGNOSIS — B9689 Other specified bacterial agents as the cause of diseases classified elsewhere: Secondary | ICD-10-CM | POA: Diagnosis not present

## 2022-04-12 DIAGNOSIS — R69 Illness, unspecified: Secondary | ICD-10-CM | POA: Diagnosis not present

## 2022-04-12 DIAGNOSIS — R7301 Impaired fasting glucose: Secondary | ICD-10-CM | POA: Diagnosis not present

## 2022-04-12 DIAGNOSIS — M8589 Other specified disorders of bone density and structure, multiple sites: Secondary | ICD-10-CM | POA: Diagnosis not present

## 2022-04-12 DIAGNOSIS — E785 Hyperlipidemia, unspecified: Secondary | ICD-10-CM | POA: Diagnosis not present

## 2022-04-12 DIAGNOSIS — Z1231 Encounter for screening mammogram for malignant neoplasm of breast: Secondary | ICD-10-CM | POA: Diagnosis not present

## 2022-04-12 DIAGNOSIS — I10 Essential (primary) hypertension: Secondary | ICD-10-CM | POA: Diagnosis not present

## 2022-04-13 DIAGNOSIS — Z9181 History of falling: Secondary | ICD-10-CM | POA: Diagnosis not present

## 2022-04-13 DIAGNOSIS — E669 Obesity, unspecified: Secondary | ICD-10-CM | POA: Diagnosis not present

## 2022-04-13 DIAGNOSIS — Z139 Encounter for screening, unspecified: Secondary | ICD-10-CM | POA: Diagnosis not present

## 2022-04-13 DIAGNOSIS — Z1331 Encounter for screening for depression: Secondary | ICD-10-CM | POA: Diagnosis not present

## 2022-04-13 DIAGNOSIS — E785 Hyperlipidemia, unspecified: Secondary | ICD-10-CM | POA: Diagnosis not present

## 2022-04-13 DIAGNOSIS — R7301 Impaired fasting glucose: Secondary | ICD-10-CM | POA: Diagnosis not present

## 2022-04-13 DIAGNOSIS — Z6834 Body mass index (BMI) 34.0-34.9, adult: Secondary | ICD-10-CM | POA: Diagnosis not present

## 2022-04-13 DIAGNOSIS — Z0000 Encounter for general adult medical examination without abnormal findings: Secondary | ICD-10-CM | POA: Diagnosis not present

## 2022-06-01 DIAGNOSIS — R531 Weakness: Secondary | ICD-10-CM | POA: Diagnosis not present

## 2022-06-01 DIAGNOSIS — R42 Dizziness and giddiness: Secondary | ICD-10-CM | POA: Diagnosis not present

## 2022-06-01 DIAGNOSIS — E611 Iron deficiency: Secondary | ICD-10-CM | POA: Diagnosis not present

## 2022-06-01 DIAGNOSIS — E538 Deficiency of other specified B group vitamins: Secondary | ICD-10-CM | POA: Diagnosis not present

## 2022-09-07 DIAGNOSIS — G729 Myopathy, unspecified: Secondary | ICD-10-CM | POA: Diagnosis not present

## 2022-09-07 DIAGNOSIS — Z6834 Body mass index (BMI) 34.0-34.9, adult: Secondary | ICD-10-CM | POA: Diagnosis not present

## 2022-09-07 DIAGNOSIS — R7301 Impaired fasting glucose: Secondary | ICD-10-CM | POA: Diagnosis not present

## 2022-09-07 DIAGNOSIS — R079 Chest pain, unspecified: Secondary | ICD-10-CM | POA: Diagnosis not present

## 2022-09-07 DIAGNOSIS — I10 Essential (primary) hypertension: Secondary | ICD-10-CM | POA: Diagnosis not present

## 2022-09-07 DIAGNOSIS — E785 Hyperlipidemia, unspecified: Secondary | ICD-10-CM | POA: Diagnosis not present

## 2022-09-20 ENCOUNTER — Encounter: Payer: Self-pay | Admitting: Cardiology

## 2022-09-24 ENCOUNTER — Ambulatory Visit: Payer: Medicare HMO | Attending: Cardiology | Admitting: Cardiology

## 2022-09-24 ENCOUNTER — Encounter: Payer: Self-pay | Admitting: Cardiology

## 2022-09-24 VITALS — BP 180/94 | HR 62 | Ht 62.0 in | Wt 197.4 lb

## 2022-09-24 DIAGNOSIS — R0989 Other specified symptoms and signs involving the circulatory and respiratory systems: Secondary | ICD-10-CM | POA: Diagnosis not present

## 2022-09-24 DIAGNOSIS — I10 Essential (primary) hypertension: Secondary | ICD-10-CM | POA: Diagnosis not present

## 2022-09-24 DIAGNOSIS — R072 Precordial pain: Secondary | ICD-10-CM | POA: Diagnosis not present

## 2022-09-24 DIAGNOSIS — E785 Hyperlipidemia, unspecified: Secondary | ICD-10-CM

## 2022-09-24 DIAGNOSIS — R0609 Other forms of dyspnea: Secondary | ICD-10-CM

## 2022-09-24 DIAGNOSIS — R0789 Other chest pain: Secondary | ICD-10-CM

## 2022-09-24 HISTORY — DX: Other chest pain: R07.89

## 2022-09-24 HISTORY — DX: Hyperlipidemia, unspecified: E78.5

## 2022-09-24 MED ORDER — METOPROLOL SUCCINATE ER 25 MG PO TB24: 25.0000 mg | ORAL_TABLET | Freq: Every day | ORAL | 3 refills | Status: DC

## 2022-09-24 NOTE — Addendum Note (Signed)
Addended by: Jacobo Forest D on: 09/24/2022 02:36 PM   Modules accepted: Orders

## 2022-09-24 NOTE — Progress Notes (Signed)
Cardiology Consultation:    Date:  09/24/2022   ID:  [name], DOB 1956/11/28, MRN [id number]  PCP:  Nicoletta Dress, MD  Cardiologist:  Jenne Campus, MD   Referring MD: Nicoletta Dress, MD   Chief Complaint  Patient presents with   cold sweat   Dizziness   loss balance   chest discomfort    2 episodes in the last 6 months    History of Present Illness:    [name] is a 65 y.o. female who is being seen today for the evaluation of chest pain and dizziness at the request of Nicoletta Dress, MD. past medical history significant for essential hypertension, dyslipidemia with intolerance to multiple medications, diabetes.  She was referred to [place] because of episode of dizziness lightheadedness and atypical chest pain.  She said she had 2 episodes of this sensation so far within the last 6 months.  Last episodes was about 2 months ago she said she was sitting on the couch with her daughter 52 get up to try to go to the house it was very hot that day.  She started feeling very dizzy very diaphoretic.  Entire sensation lasted for about half an hour.  She was able to get home drink some water and then she sat down in her recliner.  After that she did quite okay but still is weak tired and exhausted.  She describes similar episodes about 6 months ago.  Otherwise she says she is doing fair she is able to walk climb stairs but however she described the fact that she is always very weak tired exhausted she does not want to do much.  She spent time sitting in the chair or laying down in the bed.  She does have depression that being managed by primary care physician.  She quit smoking 4 weeks ago used to smoke 1 to 2 packs/day, she does have dyslipidemia with intolerance to multiple statins, she does have family history of coronary artery disease she is 51 right now she did have family members who started having problems at that age she is very worried about it.  She does not  exercise on the regular basis she is not on any special diet.  She never completely passed out she never fell down.  She denies having any palpitations  Past Medical History:  Diagnosis Date   Anxiety    Claustrophobia    COPD (chronic obstructive pulmonary disease) (HCC)    Deafness    right ear   Depression    Facet arthritis of lumbar region    GERD (gastroesophageal reflux disease)    Hypertension, essential, benign    Meniere disease    PONV (postoperative nausea and vomiting)    Primary localized osteoarthritis of left knee    Severe episode of recurrent major depressive disorder, without psychotic features ([place])    Sleep apnea    moderate; does not wear CPAP    Urinary incontinence    has increased urination at bedtime; pt wears pads at bedtime, but still wets the bed    Past Surgical History:  Procedure Laterality Date   ABDOMINAL HYSTERECTOMY     BLADDER SUSPENSION     CARPAL TUNNEL RELEASE Left 2003   Performed by Dr. Maia Breslow   CESAREAN SECTION     EYE SURGERY     melanoma removed from right eye   KNEE ARTHROSCOPY Left 12/03/2004   TOTAL KNEE ARTHROPLASTY Left 02/07/2015  dr Noemi Chapel   TOTAL KNEE ARTHROPLASTY Left 02/07/2015   Procedure: LEFT TOTAL KNEE ARTHROPLASTY;  Surgeon: Elsie Saas, MD;  Location: [facility];  Service: Orthopedics;  Laterality: Left;    Current Medications: Current Meds  Medication Sig   acetaminophen (TYLENOL) 325 MG tablet Take 2 tablets (650 mg total) by mouth every 6 (six) hours as needed for mild pain (or Fever >/= 101).   albuterol (VENTOLIN HFA) 108 (90 Base) MCG/ACT inhaler Inhale 2 puffs into the lungs every 6 (six) hours as needed for wheezing or shortness of breath.   ALPRAZolam (XANAX) 0.5 MG tablet Take 0.5 mg by mouth 3 (three) times daily as needed for anxiety.   amLODipine (NORVASC) 10 MG tablet Take 10 mg by mouth daily.   aspirin EC 81 MG tablet Take 81 mg by mouth daily. Swallow whole.   ezetimibe (ZETIA) 10 MG  tablet Take 10 mg by mouth daily.   lansoprazole (PREVACID) 30 MG capsule Take 30 mg by mouth daily at 12 noon.   lisinopril (ZESTRIL) 40 MG tablet Take 40 mg by mouth at bedtime.   loratadine (ALLERGY RELIEF) 10 MG tablet Take 10 mg by mouth daily.   meclizine (ANTIVERT) 25 MG tablet Take 25 mg by mouth 3 (three) times daily as needed for dizziness.   metoprolol succinate (TOPROL-XL) 25 MG 24 hr tablet Take 50 mg by mouth daily.   [DISCONTINUED] losartan-hydrochlorothiazide (HYZAAR) 100-12.5 MG tablet Take 1 tablet by mouth daily.     Allergies:   Lunesta [eszopiclone], Zocor [simvastatin], Avelox [moxifloxacin hcl in nacl], Ceclor [cefaclor], Codeine, and Penicillins   Social History   Socioeconomic History   Marital status: Married    Spouse name: Not on file   Number of children: Not on file   Years of education: Not on file   Highest education level: Not on file  Occupational History   Not on file  Tobacco Use   Smoking status: Every Day    Packs/day: 1.00    Years: 8.00    Total pack years: 8.00    Types: Cigarettes   Smokeless tobacco: Never  Substance and Sexual Activity   Alcohol use: No   Drug use: No   Sexual activity: Not on file  Other Topics Concern   Not on file  Social History Narrative   Not on file   Social Determinants of Health   Financial Resource Strain: Not on file  Food Insecurity: Not on file  Transportation Needs: Not on file  Physical Activity: Not on file  Stress: Not on file  Social Connections: Not on file     Family History: The patient's family history includes Alzheimer's disease in her father and mother; Bone cancer in her brother; Breast cancer in her maternal aunt; Congestive Heart Failure in her brother and father; Heart attack in her mother; Hypertension in her brother and mother; Prostate cancer in her brother; Stroke in her mother; Thyroid disease in her father and mother. ROS:   Please see the history of present illness.     All 14 point review of systems negative except as described per history of present illness.  EKGs/Labs/Other Studies Reviewed:    The following studies were reviewed today:   EKG:  EKG is  ordered today.  The ekg ordered today demonstrates sinus bradycardia, rate 48, normal P interval normal QS complex duration morphology no ST segment changes  Recent Labs: No results found for requested labs within last 365 days.  Recent Lipid Panel  No results found for: "CHOL", "TRIG", "HDL", "CHOLHDL", "VLDL", "LDLCALC", "LDLDIRECT"  Physical Exam:    VS:  BP (!) 180/94 (BP Location: Left Arm, Patient Position: Sitting)   Pulse 62   Ht '5\' 2"'$  (1.575 m)   Wt 197 lb 6.4 oz (89.5 kg)   SpO2 94%   BMI 36.10 kg/m     Wt Readings from Last 3 Encounters:  09/24/22 197 lb 6.4 oz (89.5 kg)  09/07/22 188 lb (85.3 kg)  02/07/15 192 lb (87.1 kg)     GEN:  Well nourished, well developed in no acute distress HEENT: Normal NECK: No JVD; left carotid bruit heard LYMPHATICS: No lymphadenopathy CARDIAC: RRR, n soft stock murmur grade 1/6 to 2/6 best heard right upper portion of the sternum no rubs, no gallops RESPIRATORY:  Clear to auscultation without rales, wheezing or rhonchi  ABDOMEN: Soft, non-tender, non-distended MUSCULOSKELETAL:  No edema; No deformity  SKIN: Warm and dry NEUROLOGIC:  Alert and oriented x 3 PSYCHIATRIC:  Normal affect   ASSESSMENT:    1. Primary hypertension   2. Atypical chest pain   3. Dyslipidemia    PLAN:    In order of problems listed above:  Atypical chest pain.  She does have multiple risk factors for coronary artery disease we must rule out coronary artery disease even though the chest pain that she describes is there all the time since the time she had episode 2 months ago does not look cardiac like.  But because of her multiple risk factors we need to make sure she does not have significant coronary artery disease.,  Therefore, I will ask her to have coronary  CT angiogram.  In the meantime I will cut down her metoprolol she takes 50 mg I will ask her to go only to 25.  This is because of significant bradycardia. Essential hypertension.  Blood pressure still not well controlled but this is first visit in my office.  I will do Chem-7 to decide about potentially adding small dose of diuretics. Dyslipidemia.  Intolerant to statin she is taking Zetia.  However I did review her K PN which show me her LDL of 85 HDL 64 we will wait for results of coronary CT angio to decide about aggressiveness of treatment.  A part of evaluation she also will have an echocardiogram done to assess left ventricle ejection fraction. Left carotid bruit.  I will ask her to have carotic ultrasounds to make sure she does not have any significant stenosis there.   Medication Adjustments/Labs and Tests Ordered: Current medicines are reviewed at length with the patient today.  Concerns regarding medicines are outlined above.  No orders of the defined types were placed in this encounter.  No orders of the defined types were placed in this encounter.   Signed, Park Liter, MD, [facility]. 09/24/2022 1:59 PM    [facility]

## 2022-09-24 NOTE — Patient Instructions (Signed)
Medication Instructions:  Your physician has recommended you make the following change in your medication:   DECREASE: Metoprolol to '25mg'$  daily   On the day of CT Scan- Take Metoprolol '50mg'$  2 hours prior to CT Scan     *If you need a refill on your cardiac medications before your next appointment, please call your pharmacy*   Lab Work:  BMP- Today  If you have labs (blood work) drawn today and your tests are completely normal, you will receive your results only by: [place] (if you have MyChart) OR A paper copy in the mail If you have any lab test that is abnormal or we need to change your treatment, we will call you to review the results.   Testing/Procedures: Your physician has requested that you have cardiac CT. Cardiac computed tomography (CT) is a painless test that uses an x-ray machine to take clear, detailed pictures of your heart. For further information please visit [email or web address]. Please follow instruction sheet as given.    Your Cardiac CT will be scheduled at:   [facility] located off [facility] at the hospital.  Please arrive 30 minutes prior to your appointment time.  You can use the FREE valet parking offered at entrance to outpatient center (encouraged to control the heart rate for the test)   Please follow these instructions carefully (unless otherwise directed):   On the Night Before the Test: Be sure to Drink plenty of water. Do not consume any caffeinated/decaffeinated beverages or chocolate 12 hours prior to your test. Do not take any antihistamines 12 hours prior to your test.  On the Day of the Test: Drink plenty of water until 1 hour prior to the test. Do not eat any food 4 hours prior to the test. No smoking 4 hours prior to test. You may take your regular medications prior to the test.  Take metoprolol (Lopressor) two hours prior to test. FEMALES- please wear underwire-free bra if available, avoid dresses  & tight clothing. Wear plain shirt no beads, sparkles, rhinestones, metal or heavy embroidery.  After the Test: Drink plenty of water. After receiving IV contrast, you may experience a mild flushed feeling. This is normal. On occasion, you may experience a mild rash up to 24 hours after the test. This is not dangerous. If this occurs, you can take Benadryl 25 mg and increase your fluid intake. If you experience trouble breathing, this can be serious. If it is severe call 911 IMMEDIATELY. If it is mild, please call our office. If you take any of these medications: Glipizide/Metformin, Avandament, Glucavance, please do not take 48 hours after completing test unless otherwise instructed.  We will call to schedule your test 2-4 weeks out understanding that some insurance companies will need an authorization prior to the service being performed.   Your physician has requested that you have an echocardiogram. Echocardiography is a painless test that uses sound waves to create images of your heart. It provides your doctor with information about the size and shape of your heart and how well your heart's chambers and valves are working. This procedure takes approximately one hour. There are no restrictions for this procedure. Please do NOT wear cologne, perfume, aftershave, or lotions (deodorant is allowed). Please arrive 15 minutes prior to your appointment time.   Your physician has requested that you have a carotid duplex. This test is an ultrasound of the carotid arteries in your neck. It looks at blood flow through these arteries that  supply the brain with blood. Allow one hour for this exam. There are no restrictions or special instructions.      Follow-Up: At [facility], you and your health needs are our priority.  As part of our continuing mission to provide you with exceptional heart care, we have created designated Provider Care Teams.  These Care Teams include your primary  Cardiologist (physician) and Advanced Practice Providers (APPs -  Physician Assistants and Nurse Practitioners) who all work together to provide you with the care you need, when you need it.  We recommend signing up for the patient portal called "MyChart".  Sign up information is provided on this After Visit Summary.  MyChart is used to connect with patients for Virtual Visits (Telemedicine).  Patients are able to view lab/test results, encounter notes, upcoming appointments, etc.  Non-urgent messages can be sent to your provider as well.   To learn more about what you can do with MyChart, go to [email or web address].    Your next appointment:   2 month(s)  The format for your next appointment:   In Person  Provider:   Jenne Campus, MD   Other Instructions Cardiac CT Angiogram A cardiac CT angiogram is a procedure to look at the heart and the area around the heart. It may be done to help find the cause of chest pains or other symptoms of heart disease. During this procedure, a substance called contrast dye is injected into the blood vessels in the area to be checked. A large X-ray machine, called a CT scanner, then takes detailed pictures of the heart and the surrounding area. The procedure is also sometimes called a coronary CT angiogram, coronary artery scanning, or CTA. A cardiac CT angiogram allows the health care provider to see how well blood is flowing to and from the heart. The health care provider will be able to see if there are any problems, such as: Blockage or narrowing of the coronary arteries in the heart. Fluid around the heart. Signs of weakness or disease in the muscles, valves, and tissues of the heart. Tell a health care provider about: Any allergies you have. This is especially important if you have had a previous allergic reaction to contrast dye. All medicines you are taking, including vitamins, herbs, eye drops, creams, and over-the-counter medicines. Any blood  disorders you have. Any surgeries you have had. Any medical conditions you have. Whether you are pregnant or may be pregnant. Any anxiety disorders, chronic pain, or other conditions you have that may increase your stress or prevent you from lying still. What are the risks? Generally, this is a safe procedure. However, problems may occur, including: Bleeding. Infection. Allergic reactions to medicines or dyes. Damage to other structures or organs. Kidney damage from the contrast dye that is used. Increased risk of cancer from radiation exposure. This risk is low. Talk with your health care provider about: The risks and benefits of testing. How you can receive the lowest dose of radiation. What happens before the procedure? Wear comfortable clothing and remove any jewelry, glasses, dentures, and hearing aids. Follow instructions from your health care provider about eating and drinking. This may include: For 12 hours before the procedure -- avoid caffeine. This includes tea, coffee, soda, energy drinks, and diet pills. Drink plenty of water or other fluids that do not have caffeine in them. Being well hydrated can prevent complications. For 4-6 hours before the procedure -- stop eating and drinking. The contrast dye can cause nausea,  but this is less likely if your stomach is empty. Ask your health care provider about changing or stopping your regular medicines. This is especially important if you are taking diabetes medicines, blood thinners, or medicines to treat problems with erections (erectile dysfunction). What happens during the procedure?  Hair on your chest may need to be removed so that small sticky patches called electrodes can be placed on your chest. These will transmit information that helps to monitor your heart during the procedure. An IV will be inserted into one of your veins. You might be given a medicine to control your heart rate during the procedure. This will help to  ensure that good images are obtained. You will be asked to lie on an exam table. This table will slide in and out of the CT machine during the procedure. Contrast dye will be injected into the IV. You might feel warm, or you may get a metallic taste in your mouth. You will be given a medicine called nitroglycerin. This will relax or dilate the arteries in your heart. The table that you are lying on will move into the CT machine tunnel for the scan. The person running the machine will give you instructions while the scans are being done. You may be asked to: Keep your arms above your head. Hold your breath. Stay very still, even if the table is moving. When the scanning is complete, you will be moved out of the machine. The IV will be removed. The procedure may vary among health care providers and hospitals. What can I expect after the procedure? After your procedure, it is common to have: A metallic taste in your mouth from the contrast dye. A feeling of warmth. A headache from the nitroglycerin. Follow these instructions at home: Take over-the-counter and prescription medicines only as told by your health care provider. If you are told, drink enough fluid to keep your urine pale yellow. This will help to flush the contrast dye out of your body. Most people can return to their normal activities right after the procedure. Ask your health care provider what activities are safe for you. It is up to you to get the results of your procedure. Ask your health care provider, or the department that is doing the procedure, when your results will be ready. Keep all follow-up visits as told by your health care provider. This is important. Contact a health care provider if: You have any symptoms of allergy to the contrast dye. These include: Shortness of breath. Rash or hives. A racing heartbeat. Summary A cardiac CT angiogram is a procedure to look at the heart and the area around the heart. It may  be done to help find the cause of chest pains or other symptoms of heart disease. During this procedure, a large X-ray machine, called a CT scanner, takes detailed pictures of the heart and the surrounding area after a contrast dye has been injected into blood vessels in the area. Ask your health care provider about changing or stopping your regular medicines before the procedure. This is especially important if you are taking diabetes medicines, blood thinners, or medicines to treat erectile dysfunction. If you are told, drink enough fluid to keep your urine pale yellow. This will help to flush the contrast dye out of your body. This information is not intended to replace advice given to you by your health care provider. Make sure you discuss any questions you have with your health care provider. Document Revised: 03/08/2022  Document Reviewed: 07/15/2019 Elsevier Patient Education  [place]

## 2022-09-26 LAB — BASIC METABOLIC PANEL
BUN/Creatinine Ratio: 20 (ref 12–28)
BUN: 18 mg/dL (ref 8–27)
CO2: 21 mmol/L (ref 20–29)
Calcium: 9.7 mg/dL (ref 8.7–10.3)
Chloride: 104 mmol/L (ref 96–106)
Creatinine, Ser: 0.9 mg/dL (ref 0.57–1.00)
Glucose: 101 mg/dL — ABNORMAL HIGH (ref 70–99)
Potassium: 4.1 mmol/L (ref 3.5–5.2)
Sodium: 141 mmol/L (ref 134–144)
eGFR: 70 mL/min/{1.73_m2} (ref >59)

## 2022-10-01 ENCOUNTER — Telehealth: Payer: Self-pay | Admitting: Cardiology

## 2022-10-01 NOTE — Telephone Encounter (Signed)
[name] with [facility] states because the patient has [facility] they are needing an authorization by 4:00 PM this afternoon or patient's CT (10/31 9:00 AM) will be cancelled. A call may be returned to [place] at [phone].

## 2022-10-01 NOTE — Telephone Encounter (Signed)
Spoke with [name] at [facility]. Advised that [name] sent a message that pt did not need Pre-cert for CT angio at [facility].

## 2022-10-02 DIAGNOSIS — R079 Chest pain, unspecified: Secondary | ICD-10-CM | POA: Diagnosis not present

## 2022-10-02 DIAGNOSIS — I2584 Coronary atherosclerosis due to calcified coronary lesion: Secondary | ICD-10-CM | POA: Diagnosis not present

## 2022-10-02 DIAGNOSIS — I2510 Atherosclerotic heart disease of native coronary artery without angina pectoris: Secondary | ICD-10-CM | POA: Diagnosis not present

## 2022-10-10 ENCOUNTER — Ambulatory Visit: Payer: Medicare HMO | Attending: Cardiology

## 2022-10-10 ENCOUNTER — Encounter: Payer: Self-pay | Admitting: Cardiology

## 2022-10-10 ENCOUNTER — Ambulatory Visit (INDEPENDENT_AMBULATORY_CARE_PROVIDER_SITE_OTHER): Payer: Medicare HMO

## 2022-10-10 DIAGNOSIS — R0989 Other specified symptoms and signs involving the circulatory and respiratory systems: Secondary | ICD-10-CM | POA: Diagnosis not present

## 2022-10-10 DIAGNOSIS — R0609 Other forms of dyspnea: Secondary | ICD-10-CM | POA: Diagnosis not present

## 2022-10-10 LAB — ECHOCARDIOGRAM COMPLETE
Area-P 1/2: 3.87 cm2
S' Lateral: 2.7 cm

## 2022-10-11 DIAGNOSIS — T466X5A Adverse effect of antihyperlipidemic and antiarteriosclerotic drugs, initial encounter: Secondary | ICD-10-CM | POA: Diagnosis not present

## 2022-10-11 DIAGNOSIS — I5189 Other ill-defined heart diseases: Secondary | ICD-10-CM | POA: Diagnosis not present

## 2022-10-11 DIAGNOSIS — G720 Drug-induced myopathy: Secondary | ICD-10-CM | POA: Diagnosis not present

## 2022-10-11 DIAGNOSIS — E785 Hyperlipidemia, unspecified: Secondary | ICD-10-CM | POA: Diagnosis not present

## 2022-10-11 DIAGNOSIS — I6529 Occlusion and stenosis of unspecified carotid artery: Secondary | ICD-10-CM | POA: Diagnosis not present

## 2022-10-11 DIAGNOSIS — M1991 Primary osteoarthritis, unspecified site: Secondary | ICD-10-CM | POA: Diagnosis not present

## 2022-10-11 DIAGNOSIS — R69 Illness, unspecified: Secondary | ICD-10-CM | POA: Diagnosis not present

## 2022-10-11 DIAGNOSIS — Y738 Miscellaneous gastroenterology and urology devices associated with adverse incidents, not elsewhere classified: Secondary | ICD-10-CM | POA: Diagnosis not present

## 2022-10-11 DIAGNOSIS — I25119 Atherosclerotic heart disease of native coronary artery with unspecified angina pectoris: Secondary | ICD-10-CM | POA: Diagnosis not present

## 2022-10-11 DIAGNOSIS — I10 Essential (primary) hypertension: Secondary | ICD-10-CM | POA: Diagnosis not present

## 2022-10-12 ENCOUNTER — Telehealth: Payer: Self-pay | Admitting: Cardiology

## 2022-10-12 NOTE — Telephone Encounter (Signed)
Spoke with Jerene Pitch at [facility], She stated the pt has established care with Dr. Venetia Maxon. She requested labs and tests be sent to Dr. Venetia Maxon. She will send a fax to Dr. Agustin Cree regarding the same.

## 2022-10-12 NOTE — Telephone Encounter (Signed)
Caller needs to report information.

## 2022-10-17 ENCOUNTER — Encounter: Payer: Self-pay | Admitting: Cardiology

## 2022-10-17 ENCOUNTER — Ambulatory Visit: Payer: Medicare HMO | Attending: Cardiology | Admitting: Cardiology

## 2022-10-17 VITALS — BP 188/88 | HR 58 | Ht 61.5 in | Wt 194.4 lb

## 2022-10-17 DIAGNOSIS — I10 Essential (primary) hypertension: Secondary | ICD-10-CM

## 2022-10-17 DIAGNOSIS — E785 Hyperlipidemia, unspecified: Secondary | ICD-10-CM | POA: Diagnosis not present

## 2022-10-17 DIAGNOSIS — I25118 Atherosclerotic heart disease of native coronary artery with other forms of angina pectoris: Secondary | ICD-10-CM

## 2022-10-17 DIAGNOSIS — I209 Angina pectoris, unspecified: Secondary | ICD-10-CM

## 2022-10-17 DIAGNOSIS — I2510 Atherosclerotic heart disease of native coronary artery without angina pectoris: Secondary | ICD-10-CM | POA: Insufficient documentation

## 2022-10-17 HISTORY — DX: Angina pectoris, unspecified: I20.9

## 2022-10-17 HISTORY — DX: Atherosclerotic heart disease of native coronary artery without angina pectoris: I25.10

## 2022-10-17 MED ORDER — NITROGLYCERIN 0.4 MG SL SUBL: 0.4000 mg | SUBLINGUAL_TABLET | SUBLINGUAL | 6 refills | Status: DC | PRN

## 2022-10-17 MED ORDER — ISOSORBIDE MONONITRATE ER 30 MG PO TB24: 30.0000 mg | ORAL_TABLET | Freq: Every day | ORAL | 3 refills | Status: DC

## 2022-10-17 MED ORDER — ROSUVASTATIN CALCIUM 20 MG PO TABS: 20.0000 mg | ORAL_TABLET | Freq: Every day | ORAL | 3 refills | Status: DC

## 2022-10-17 NOTE — Patient Instructions (Signed)
Medication Instructions:  START: Imdur '30mg'$  1 tablet daily  START: Crestor '20mg'$  1 tablet daily  START: Nitroglycerin Use nitroglycerin 1 tablet placed under the tongue at the first sign of chest pain or an angina attack. 1 tablet may be used every 5 minutes as needed, for up to 15 minutes. Do not take more than 3 tablets in 15 minutes. If pain persist call 911 or go to the nearest ED.    HOLD:  Lisinopril and Hydrochlorothiazide the day of Catheterization     *If you need a refill on your cardiac medications before your next appointment, please call your pharmacy*   Lab Work: CBC & BMP - today  If you have labs (blood work) drawn today and your tests are completely normal, you will receive your results only by: [place] (if you have MyChart) OR A paper copy in the mail If you have any lab test that is abnormal or we need to change your treatment, we will call you to review the results.   Testing/Procedures:       Cardiac/Peripheral Catheterization   You are scheduled for a Cardiac Catheterization on Monday, November 20 with Dr. Lauree Chandler.  1. Please arrive at the Main Entrance A at [facility]: [place], [place] [address] on November 20 at 8:30 AM (This time is two hours before your procedure to ensure your preparation). Free valet parking service is available. You will check in at ADMITTING. The support person will be asked to wait in the waiting room.  It is OK to have someone drop you off and come back when you are ready to be discharged.        Special note: Every effort is made to have your procedure done on time. Please understand that emergencies sometimes delay scheduled procedures.   . 2. Diet: Do not eat solid foods after midnight.  You may have clear liquids until 5 AM the day of the procedure.  3. 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 You do not need  to be fasting.  4. Medication instructions in preparation for your procedure:    Stop taking, Lisinopril (Zestril or Prinivil) Monday, November 20,, HTCZ (Hydrochlorothiazide) Monday, November 20,      On the morning of your procedure, take Aspirin 81 mg and any morning medicines NOT listed above.  You may use sips of water.  5. Plan to go home the same day, you will only stay overnight if medically necessary. 6. You MUST have a responsible adult to drive you home. 7. An adult MUST be with you the first 24 hours after you arrive home. 8. Bring a current list of your medications, and the last time and date medication taken. 9. Bring ID and current insurance cards. 10.Please wear clothes that are easy to get on and off and wear slip-on shoes.  Thank you for allowing [place] to care for you!   -- [facility]    Follow-Up: At [facility], you and your health needs are our priority.  As part of our continuing mission to provide you with exceptional heart care, we have created designated Provider Care Teams.  These Care Teams include your primary Cardiologist (physician) and Advanced Practice Providers (APPs -  Physician Assistants and Nurse Practitioners) who all work together to provide you with the care you need, when you need it.  We recommend  signing up for the patient portal called "MyChart".  Sign up information is provided on this After Visit Summary.  MyChart is used to connect with patients for Virtual Visits (Telemedicine).  Patients are able to view lab/test results, encounter notes, upcoming appointments, etc.  Non-urgent messages can be sent to your provider as well.   To learn more about what you can do with MyChart, go to [email or web address].    Your next appointment:   2 month(s)  The format for your next appointment:   In Person  Provider:   Jenne Campus, MD   Other Instructions  Coronary Angiogram With Stent Coronary  angiogram with stent placement is a procedure to widen or open a narrow blood vessel of the heart (coronary artery). Arteries may become blocked by cholesterol buildup (plaques) in the lining of the artery wall. When a coronary artery becomes partially blocked, blood flow to that area decreases. This may lead to chest pain or a heart attack (myocardial infarction). A stent is a small piece of metal that looks like mesh or spring. Stent placement may be done as treatment after a heart attack, or to prevent a heart attack if a blocked artery is found by a coronary angiogram. Let your health care provider know about: Any allergies you have, including allergies to medicines or contrast dye. All medicines you are taking, including vitamins, herbs, eye drops, creams, and over-the-counter medicines. Any problems you or family members have had with anesthetic medicines. Any blood disorders you have. Any surgeries you have had. Any medical conditions you have, including kidney problems or kidney failure. Whether you are pregnant or may be pregnant. Whether you are breastfeeding. What are the risks? Generally, this is a safe procedure. However, serious problems may occur, including: Damage to nearby structures or organs, such as the heart, blood vessels, or kidneys. A return of blockage. Bleeding, infection, or bruising at the insertion site. A collection of blood under the skin (hematoma) at the insertion site. A blood clot in another part of the body. Allergic reaction to medicines or dyes. Bleeding into the abdomen (retroperitoneal bleeding). Stroke (rare). Heart attack (rare). What happens before the procedure? Staying hydrated Follow instructions from your health care provider about hydration, which may include: Up to 2 hours before the procedure - you may continue to drink clear liquids, such as water, clear fruit juice, black coffee, and plain tea.    Eating and drinking restrictions Follow  instructions from your health care provider about eating and drinking, which may include: 8 hours before the procedure - stop eating heavy meals or foods, such as meat, fried foods, or fatty foods. 6 hours before the procedure - stop eating light meals or foods, such as toast or cereal. 2 hours before the procedure - stop drinking clear liquids. Medicines Ask your health care provider about: Changing or stopping your regular medicines. This is especially important if you are taking diabetes medicines or blood thinners. Taking medicines such as aspirin and ibuprofen. These medicines can thin your blood. Do not take these medicines unless your health care provider tells you to take them. Generally, aspirin is recommended before a thin tube, called a catheter, is passed through a blood vessel and inserted into the heart (cardiac catheterization). Taking over-the-counter medicines, vitamins, herbs, and supplements. General instructions Do not use any products that contain nicotine or tobacco for at least 4 weeks before the procedure. These products include cigarettes, e-cigarettes, and chewing tobacco. If you need help quitting, ask your  health care provider. Plan to have someone take you home from the hospital or clinic. If you will be going home right after the procedure, plan to have someone with you for 24 hours. You may have tests and imaging procedures. Ask your health care provider: How your insertion site will be marked. Ask which artery will be used for the procedure. What steps will be taken to help prevent infection. These may include: Removing hair at the insertion site. Washing skin with a germ-killing soap. Taking antibiotic medicine. What happens during the procedure? An IV will be inserted into one of your veins. Electrodes may be placed on your chest to monitor your heart rate during the procedure. You will be given one or more of the following: A medicine to help you relax  (sedative). A medicine to numb the area (local anesthetic) for catheter insertion. A small incision will be made for catheter insertion. The catheter will be inserted into an artery using a guide wire. The location may be in your groin, your wrist, or the fold of your arm (near your elbow). An X-ray procedure (fluoroscopy) will be used to help guide the catheter to the opening of the heart arteries. A dye will be injected into the catheter. X-rays will be taken. The dye helps to show where any narrowing or blockages are located in the arteries. Tell your health care provider if you have chest pain or trouble breathing. A tiny wire will be guided to the blocked spot, and a balloon will be inflated to make the artery wider. The stent will be expanded to crush the plaques into the wall of the vessel. The stent will hold the area open and improve the blood flow. Most stents have a drug coating to reduce the risk of the stent narrowing over time. The artery may be made wider using a drill, laser, or other tools that remove plaques. The catheter will be removed when the blood flow improves. The stent will stay where it was placed, and the lining of the artery will grow over it. A bandage (dressing) will be placed on the insertion site. Pressure will be applied to stop bleeding. The IV will be removed. This procedure may vary among health care providers and hospitals.    What happens after the procedure? Your blood pressure, heart rate, breathing rate, and blood oxygen level will be monitored until you leave the hospital or clinic. If the procedure is done through the leg, you will lie flat in bed for a few hours or for as long as told by your health care provider. You will be instructed not to bend or cross your legs. The insertion site and the pulse in your foot or wrist will be checked often. You may have more blood tests, X-rays, and a test that records the electrical activity of your heart  (electrocardiogram, or ECG). Do not drive for 24 hours if you were given a sedative during your procedure. Summary Coronary angiogram with stent placement is a procedure to widen or open a narrowed coronary artery. This is done to treat heart problems. Before the procedure, let your health care provider know about all the medical conditions and surgeries you have or have had. This is a safe procedure. However, some problems may occur, including damage to nearby structures or organs, bleeding, blood clots, or allergies. Follow your health care provider's instructions about eating, drinking, medicines, and other lifestyle changes, such as quitting tobacco use before the procedure. This information is not  intended to replace advice given to you by your health care provider. Make sure you discuss any questions you have with your health care provider. Document Revised: 06/10/2019 Document Reviewed: 06/10/2019 Elsevier Patient Education  [place].

## 2022-10-17 NOTE — H&P (View-Only) (Signed)
Cardiology Office Note:    Date:  10/17/2022   ID:  [name], DOB Apr 06, 1956, MRN [id number]  PCP:  Street, Sharon Mt, MD  Cardiologist:  Jenne Campus, MD    Referring MD: Street, Sharon Mt, *   Chief Complaint  Patient presents with   Results    CT    History of Present Illness:    [name] is a 66 y.o. female who was referred to me about 6 weeks ago because of atypical symptoms of dizziness atypical chest pain.  Risk factors for coronary artery disease include essential hypertension, dyslipidemia with intolerance to multiple statins, diabetes, smoking likely she recently quit.  She did have coronary CT angio done which showed multiple abnormalities and she is here in my office to talk about that.  She described to have some chest pain when she is trying to walk or do things.  I have to admit that this time her symptoms look more suspicious for cardiac etiology.  Likely she still abstain from smoking.  Coronary CT angios showed possibility of severe stenosis in mid LAD, also large diagonal 1 have heavy calcification with possibility of 50 to 69% stenosis in the proximal portion, large obtuse marginal branch of very heavy calcifications with a plaque of 50 to 69% in proximal portion, right coronary artery had multiple moderate stenosis 50 to 69% at different level.  Heavily calcified.  Past Medical History:  Diagnosis Date   Anxiety    Claustrophobia    COPD (chronic obstructive pulmonary disease) (HCC)    Deafness    right ear   Depression    Facet arthritis of lumbar region    GERD (gastroesophageal reflux disease)    Hypertension, essential, benign    Meniere disease    PONV (postoperative nausea and vomiting)    Primary localized osteoarthritis of left knee    Severe episode of recurrent major depressive disorder, without psychotic features (HCC)    Sleep apnea    moderate; does not wear CPAP    Urinary incontinence    has increased urination at  bedtime; pt wears pads at bedtime, but still wets the bed    Past Surgical History:  Procedure Laterality Date   ABDOMINAL HYSTERECTOMY     BLADDER SUSPENSION     CARPAL TUNNEL RELEASE Left 2003   Performed by Dr. Maia Breslow   CESAREAN SECTION     EYE SURGERY     melanoma removed from right eye   KNEE ARTHROSCOPY Left 12/03/2004   TOTAL KNEE ARTHROPLASTY Left 02/07/2015   dr Noemi Chapel   TOTAL KNEE ARTHROPLASTY Left 02/07/2015   Procedure: LEFT TOTAL KNEE ARTHROPLASTY;  Surgeon: Elsie Saas, MD;  Location: [facility];  Service: Orthopedics;  Laterality: Left;    Current Medications: Current Meds  Medication Sig   acetaminophen (TYLENOL) 325 MG tablet Take 2 tablets (650 mg total) by mouth every 6 (six) hours as needed for mild pain (or Fever >/= 101).   albuterol (VENTOLIN HFA) 108 (90 Base) MCG/ACT inhaler Inhale 2 puffs into the lungs every 6 (six) hours as needed for wheezing or shortness of breath.   ALPRAZolam (XANAX) 0.5 MG tablet Take 0.5 mg by mouth 3 (three) times daily as needed for anxiety.   amLODipine (NORVASC) 10 MG tablet Take 10 mg by mouth daily.   aspirin EC 81 MG tablet Take 81 mg by mouth daily. Swallow whole.   ezetimibe (ZETIA) 10 MG tablet Take 10 mg by mouth daily.  hydrochlorothiazide (HYDRODIURIL) 12.5 MG tablet Take 12.5 mg by mouth daily.   isosorbide mononitrate (IMDUR) 30 MG 24 hr tablet Take 1 tablet (30 mg total) by mouth daily.   lansoprazole (PREVACID) 30 MG capsule Take 30 mg by mouth daily at 12 noon.   lisinopril (ZESTRIL) 40 MG tablet Take 40 mg by mouth at bedtime.   loratadine (ALLERGY RELIEF) 10 MG tablet Take 10 mg by mouth daily.   meclizine (ANTIVERT) 25 MG tablet Take 25 mg by mouth 3 (three) times daily as needed for dizziness.   metoprolol succinate (TOPROL-XL) 25 MG 24 hr tablet Take 1 tablet (25 mg total) by mouth daily.   nitroGLYCERIN (NITROSTAT) 0.4 MG SL tablet Place 1 tablet (0.4 mg total) under the tongue every 5 (five) minutes as  needed for chest pain.   rosuvastatin (CRESTOR) 20 MG tablet Take 1 tablet (20 mg total) by mouth daily.     Allergies:   Lunesta [eszopiclone], Zocor [simvastatin], Avelox [moxifloxacin hcl in nacl], Ceclor [cefaclor], Codeine, and Penicillins   Social History   Socioeconomic History   Marital status: Married    Spouse name: Not on file   Number of children: Not on file   Years of education: Not on file   Highest education level: Not on file  Occupational History   Not on file  Tobacco Use   Smoking status: Every Day    Packs/day: 1.00    Years: 8.00    Total pack years: 8.00    Types: Cigarettes   Smokeless tobacco: Never  Substance and Sexual Activity   Alcohol use: No   Drug use: No   Sexual activity: Not on file  Other Topics Concern   Not on file  Social History Narrative   Not on file   Social Determinants of Health   Financial Resource Strain: Not on file  Food Insecurity: Not on file  Transportation Needs: Not on file  Physical Activity: Not on file  Stress: Not on file  Social Connections: Not on file     Family History: The patient's family history includes Alzheimer's disease in her father and mother; Bone cancer in her brother; Breast cancer in her maternal aunt; Congestive Heart Failure in her brother and father; Heart attack in her mother; Hypertension in her brother and mother; Prostate cancer in her brother; Stroke in her mother; Thyroid disease in her father and mother. ROS:   Please see the history of present illness.    All 14 point review of systems negative except as described per history of present illness  EKGs/Labs/Other Studies Reviewed:        Recent Labs: 09/24/2022: BUN 18; Creatinine, Ser 0.90; Potassium 4.1; Sodium 141  Recent Lipid Panel No results found for: "CHOL", "TRIG", "HDL", "CHOLHDL", "VLDL", "LDLCALC", "LDLDIRECT"  Physical Exam:    VS:  BP (!) 188/88 (BP Location: Left Arm, Patient Position: Sitting)   Pulse (!)  58   Ht 5' 1.5" (1.562 m)   Wt 194 lb 6.4 oz (88.2 kg)   SpO2 97%   BMI 36.14 kg/m     Wt Readings from Last 3 Encounters:  10/17/22 194 lb 6.4 oz (88.2 kg)  09/24/22 197 lb 6.4 oz (89.5 kg)  09/07/22 188 lb (85.3 kg)     GEN:  Well nourished, well developed in no acute distress HEENT: Normal NECK: No JVD; No carotid bruits LYMPHATICS: No lymphadenopathy CARDIAC: RRR, no murmurs, no rubs, no gallops RESPIRATORY:  Clear to auscultation without rales,  wheezing or rhonchi  ABDOMEN: Soft, non-tender, non-distended MUSCULOSKELETAL:  No edema; No deformity  SKIN: Warm and dry LOWER EXTREMITIES: no swelling NEUROLOGIC:  Alert and oriented x 3 PSYCHIATRIC:  Normal affect   ASSESSMENT:    1. Coronary artery disease of native artery of native heart with stable angina pectoris ([place])   2. Dyslipidemia   3. Angina pectoris ([place])   4. Primary hypertension    PLAN:    In order of problems listed above:  Coronary artery disease with abnormal coronary CT angio raising suspicion for hemodynamically significant stenosis in the mid LAD, obtuse marginal branch as well as RCA.  We had a long discussion about what to do with the situation.  In my opinion the best approach is to do cardiac catheterization I described procedure to her including all risk benefits as well as alternatives.  She agreed to proceed.  In the meantime we will continue antiplatelet therapy.  I will put her on Crestor 10, I give her Imdur 30 daily as well as nitroglycerin as needed.  She was given instruction how to use nitroglycerin with specific instructions to call 911 if pain is not relieved by 3 nitroglycerin. Dyslipidemia we will try to augment her therapy she is only on Zetia I did review K PN which only data from May of this year showing LDL 85 and HDL 64.  Hopefully she will be able to tolerate Crestor previously she try simvastatin and could not take it.  If she cannot take statin then we will consider PCSK9  agents Angina pectoris.  Her still look more typical this time.  Will initiate Imdur as well as nitroglycerin as needed Essential hypertension elevated but she is very excited to stress actually crying in office hopefully addition of Imdur will help.  Obviously we will follow-up and treat appropriately.   I described cardiac catheterization to her we discussed option after cardiac catheterization which may include coronary artery bypass graft.  However we have another situation with heavily calcified coronary artery with possibility of significant stenosis.   Medication Adjustments/Labs and Tests Ordered: Current medicines are reviewed at length with the patient today.  Concerns regarding medicines are outlined above.  Orders Placed This Encounter  Procedures   CBC   Basic metabolic panel   Medication changes:  Meds ordered this encounter  Medications   rosuvastatin (CRESTOR) 20 MG tablet    Sig: Take 1 tablet (20 mg total) by mouth daily.    Dispense:  90 tablet    Refill:  3   isosorbide mononitrate (IMDUR) 30 MG 24 hr tablet    Sig: Take 1 tablet (30 mg total) by mouth daily.    Dispense:  90 tablet    Refill:  3   nitroGLYCERIN (NITROSTAT) 0.4 MG SL tablet    Sig: Place 1 tablet (0.4 mg total) under the tongue every 5 (five) minutes as needed for chest pain.    Dispense:  25 tablet    Refill:  6    Signed, Park Liter, MD, [facility] 10/17/2022 12:49 PM    [place]

## 2022-10-17 NOTE — Progress Notes (Unsigned)
Cardiology Office Note:    Date:  10/17/2022   ID:  [name], DOB [birth year], MRN [id number]  PCP:  Street, Sharon Mt, MD  Cardiologist:  Jenne Campus, MD    Referring MD: Street, Sharon Mt, *   Chief Complaint  Patient presents with   Results    CT    History of Present Illness:    [name] is a 66 y.o. female who was referred to me about 6 weeks ago because of atypical symptoms of dizziness atypical chest pain.  Risk factors for coronary artery disease include essential hypertension, dyslipidemia with intolerance to multiple statins, diabetes, smoking likely she recently quit.  She did have coronary CT angio done which showed multiple abnormalities and she is here in my office to talk about that.  She described to have some chest pain when she is trying to walk or do things.  I have to admit that this time her symptoms look more suspicious for cardiac etiology.  Likely she still abstain from smoking.  Coronary CT angios showed possibility of severe stenosis in mid LAD, also large diagonal 1 have heavy calcification with possibility of 50 to 69% stenosis in the proximal portion, large obtuse marginal branch of very heavy calcifications with a plaque of 50 to 69% in proximal portion, right coronary artery had multiple moderate stenosis 50 to 69% at different level.  Heavily calcified.  Past Medical History:  Diagnosis Date   Anxiety    Claustrophobia    COPD (chronic obstructive pulmonary disease) (HCC)    Deafness    right ear   Depression    Facet arthritis of lumbar region    GERD (gastroesophageal reflux disease)    Hypertension, essential, benign    Meniere disease    PONV (postoperative nausea and vomiting)    Primary localized osteoarthritis of left knee    Severe episode of recurrent major depressive disorder, without psychotic features (HCC)    Sleep apnea    moderate; does not wear CPAP    Urinary incontinence    has increased urination at  bedtime; pt wears pads at bedtime, but still wets the bed    Past Surgical History:  Procedure Laterality Date   ABDOMINAL HYSTERECTOMY     BLADDER SUSPENSION     CARPAL TUNNEL RELEASE Left 2003   Performed by Dr. Maia Breslow   CESAREAN SECTION     EYE SURGERY     melanoma removed from right eye   KNEE ARTHROSCOPY Left 12/03/2004   TOTAL KNEE ARTHROPLASTY Left 02/07/2015   dr Noemi Chapel   TOTAL KNEE ARTHROPLASTY Left 02/07/2015   Procedure: LEFT TOTAL KNEE ARTHROPLASTY;  Surgeon: Elsie Saas, MD;  Location: [facility];  Service: Orthopedics;  Laterality: Left;    Current Medications: Current Meds  Medication Sig   acetaminophen (TYLENOL) 325 MG tablet Take 2 tablets (650 mg total) by mouth every 6 (six) hours as needed for mild pain (or Fever >/= 101).   albuterol (VENTOLIN HFA) 108 (90 Base) MCG/ACT inhaler Inhale 2 puffs into the lungs every 6 (six) hours as needed for wheezing or shortness of breath.   ALPRAZolam (XANAX) 0.5 MG tablet Take 0.5 mg by mouth 3 (three) times daily as needed for anxiety.   amLODipine (NORVASC) 10 MG tablet Take 10 mg by mouth daily.   aspirin EC 81 MG tablet Take 81 mg by mouth daily. Swallow whole.   ezetimibe (ZETIA) 10 MG tablet Take 10 mg by mouth daily.  hydrochlorothiazide (HYDRODIURIL) 12.5 MG tablet Take 12.5 mg by mouth daily.   isosorbide mononitrate (IMDUR) 30 MG 24 hr tablet Take 1 tablet (30 mg total) by mouth daily.   lansoprazole (PREVACID) 30 MG capsule Take 30 mg by mouth daily at 12 noon.   lisinopril (ZESTRIL) 40 MG tablet Take 40 mg by mouth at bedtime.   loratadine (ALLERGY RELIEF) 10 MG tablet Take 10 mg by mouth daily.   meclizine (ANTIVERT) 25 MG tablet Take 25 mg by mouth 3 (three) times daily as needed for dizziness.   metoprolol succinate (TOPROL-XL) 25 MG 24 hr tablet Take 1 tablet (25 mg total) by mouth daily.   nitroGLYCERIN (NITROSTAT) 0.4 MG SL tablet Place 1 tablet (0.4 mg total) under the tongue every 5 (five) minutes as  needed for chest pain.   rosuvastatin (CRESTOR) 20 MG tablet Take 1 tablet (20 mg total) by mouth daily.     Allergies:   Lunesta [eszopiclone], Zocor [simvastatin], Avelox [moxifloxacin hcl in nacl], Ceclor [cefaclor], Codeine, and Penicillins   Social History   Socioeconomic History   Marital status: Married    Spouse name: Not on file   Number of children: Not on file   Years of education: Not on file   Highest education level: Not on file  Occupational History   Not on file  Tobacco Use   Smoking status: Every Day    Packs/day: 1.00    Years: 8.00    Total pack years: 8.00    Types: Cigarettes   Smokeless tobacco: Never  Substance and Sexual Activity   Alcohol use: No   Drug use: No   Sexual activity: Not on file  Other Topics Concern   Not on file  Social History Narrative   Not on file   Social Determinants of Health   Financial Resource Strain: Not on file  Food Insecurity: Not on file  Transportation Needs: Not on file  Physical Activity: Not on file  Stress: Not on file  Social Connections: Not on file     Family History: The patient's family history includes Alzheimer's disease in her father and mother; Bone cancer in her brother; Breast cancer in her maternal aunt; Congestive Heart Failure in her brother and father; Heart attack in her mother; Hypertension in her brother and mother; Prostate cancer in her brother; Stroke in her mother; Thyroid disease in her father and mother. ROS:   Please see the history of present illness.    All 14 point review of systems negative except as described per history of present illness  EKGs/Labs/Other Studies Reviewed:        Recent Labs: 09/24/2022: BUN 18; Creatinine, Ser 0.90; Potassium 4.1; Sodium 141  Recent Lipid Panel No results found for: "CHOL", "TRIG", "HDL", "CHOLHDL", "VLDL", "LDLCALC", "LDLDIRECT"  Physical Exam:    VS:  BP (!) 188/88 (BP Location: Left Arm, Patient Position: Sitting)   Pulse (!)  58   Ht 5' 1.5" (1.562 m)   Wt 194 lb 6.4 oz (88.2 kg)   SpO2 97%   BMI 36.14 kg/m     Wt Readings from Last 3 Encounters:  10/17/22 194 lb 6.4 oz (88.2 kg)  09/24/22 197 lb 6.4 oz (89.5 kg)  09/07/22 188 lb (85.3 kg)     GEN:  Well nourished, well developed in no acute distress HEENT: Normal NECK: No JVD; No carotid bruits LYMPHATICS: No lymphadenopathy CARDIAC: RRR, no murmurs, no rubs, no gallops RESPIRATORY:  Clear to auscultation without rales,  wheezing or rhonchi  ABDOMEN: Soft, non-tender, non-distended MUSCULOSKELETAL:  No edema; No deformity  SKIN: Warm and dry LOWER EXTREMITIES: no swelling NEUROLOGIC:  Alert and oriented x 3 PSYCHIATRIC:  Normal affect   ASSESSMENT:    1. Coronary artery disease of native artery of native heart with stable angina pectoris ([place])   2. Dyslipidemia   3. Angina pectoris ([place])   4. Primary hypertension    PLAN:    In order of problems listed above:  Coronary artery disease with abnormal coronary CT angio raising suspicion for hemodynamically significant stenosis in the mid LAD, obtuse marginal branch as well as RCA.  We had a long discussion about what to do with the situation.  In my opinion the best approach is to do cardiac catheterization I described procedure to her including all risk benefits as well as alternatives.  She agreed to proceed.  In the meantime we will continue antiplatelet therapy.  I will put her on Crestor 10, I give her Imdur 30 daily as well as nitroglycerin as needed.  She was given instruction how to use nitroglycerin with specific instructions to call 911 if pain is not relieved by 3 nitroglycerin. Dyslipidemia we will try to augment her therapy she is only on Zetia I did review K PN which only data from May of this year showing LDL 85 and HDL 64.  Hopefully she will be able to tolerate Crestor previously she try simvastatin and could not take it.  If she cannot take statin then we will consider PCSK9  agents Angina pectoris.  Her still look more typical this time.  Will initiate Imdur as well as nitroglycerin as needed Essential hypertension elevated but she is very excited to stress actually crying in office hopefully addition of Imdur will help.  Obviously we will follow-up and treat appropriately.   I described cardiac catheterization to her we discussed option after cardiac catheterization which may include coronary artery bypass graft.  However we have another situation with heavily calcified coronary artery with possibility of significant stenosis.   Medication Adjustments/Labs and Tests Ordered: Current medicines are reviewed at length with the patient today.  Concerns regarding medicines are outlined above.  Orders Placed This Encounter  Procedures   CBC   Basic metabolic panel   Medication changes:  Meds ordered this encounter  Medications   rosuvastatin (CRESTOR) 20 MG tablet    Sig: Take 1 tablet (20 mg total) by mouth daily.    Dispense:  90 tablet    Refill:  3   isosorbide mononitrate (IMDUR) 30 MG 24 hr tablet    Sig: Take 1 tablet (30 mg total) by mouth daily.    Dispense:  90 tablet    Refill:  3   nitroGLYCERIN (NITROSTAT) 0.4 MG SL tablet    Sig: Place 1 tablet (0.4 mg total) under the tongue every 5 (five) minutes as needed for chest pain.    Dispense:  25 tablet    Refill:  6    Signed, Park Liter, MD, [facility] 10/17/2022 12:49 PM    [place]

## 2022-10-18 ENCOUNTER — Telehealth: Payer: Self-pay | Admitting: *Deleted

## 2022-10-18 ENCOUNTER — Telehealth: Payer: Self-pay | Admitting: Cardiology

## 2022-10-18 LAB — BASIC METABOLIC PANEL
BUN/Creatinine Ratio: 19 (ref 12–28)
BUN: 16 mg/dL (ref 8–27)
CO2: 22 mmol/L (ref 20–29)
Calcium: 10.4 mg/dL — ABNORMAL HIGH (ref 8.7–10.3)
Chloride: 101 mmol/L (ref 96–106)
Creatinine, Ser: 0.86 mg/dL (ref 0.57–1.00)
Glucose: 98 mg/dL (ref 70–99)
Potassium: 4.7 mmol/L (ref 3.5–5.2)
Sodium: 141 mmol/L (ref 134–144)
eGFR: 74 mL/min/{1.73_m2} (ref >59)

## 2022-10-18 LAB — CBC
Hematocrit: 47.5 % — ABNORMAL HIGH (ref 34.0–46.6)
Hemoglobin: 15.6 g/dL (ref 11.1–15.9)
MCH: 29.1 pg (ref 26.6–33.0)
MCHC: 32.8 g/dL (ref 31.5–35.7)
MCV: 89 fL (ref 79–97)
Platelets: 341 10*3/uL (ref 150–450)
RBC: 5.36 x10E6/uL — ABNORMAL HIGH (ref 3.77–5.28)
RDW: 13 % (ref 11.7–15.4)
WBC: 8.8 10*3/uL (ref 3.4–10.8)

## 2022-10-18 MED ORDER — ROSUVASTATIN CALCIUM 20 MG PO TABS: 20.0000 mg | ORAL_TABLET | Freq: Every day | ORAL | 3 refills | Status: AC

## 2022-10-18 MED ORDER — NITROGLYCERIN 0.4 MG SL SUBL: 0.4000 mg | SUBLINGUAL_TABLET | SUBLINGUAL | 6 refills | Status: AC | PRN

## 2022-10-18 MED ORDER — METOPROLOL SUCCINATE ER 25 MG PO TB24: 25.0000 mg | ORAL_TABLET | Freq: Every day | ORAL | 3 refills | Status: DC

## 2022-10-18 MED ORDER — ISOSORBIDE MONONITRATE ER 30 MG PO TB24: 30.0000 mg | ORAL_TABLET | Freq: Every day | ORAL | 3 refills | Status: DC

## 2022-10-18 NOTE — Telephone Encounter (Signed)
Cardiac Catheterization scheduled at [facility] for: Monday October 22, 2022 10:30 AM Arrival time and place: [place] Entrance A at: 8:30 AM  Nothing to eat after midnight prior to procedure, clear liquids until 5 AM day of procedure.  Medication instructions: -Hold:  Hydrochlorothiazide-AM of procedure -Except hold medications usual morning medications can be taken with sips of water including aspirin 81 mg.  Confirmed patient has responsible adult to drive home post procedure and be with patient first 24 hours after arriving home.  Patient reports no new symptoms concerning for COVID-19 in the past 10 days.  Reviewed procedure instructions with patient.

## 2022-10-18 NOTE — Addendum Note (Signed)
Addended by: Jacobo Forest D on: 10/18/2022 01:00 PM   Modules accepted: Orders

## 2022-10-18 NOTE — Telephone Encounter (Signed)
Pt c/o medication issue:  1. Name of Medication: rosuvastatin (CRESTOR) 20 MG tablet metoprolol succinate (TOPROL-XL) 25 MG 24 hr tablet nitroGLYCERIN (NITROSTAT) 0.4 MG SL tablet isosorbide mononitrate (IMDUR) 30 MG 24 hr tablet  2. How are you currently taking this medication (dosage and times per day)? As prescribed   3. Are you having a reaction (difficulty breathing--STAT)? No   4. What is your medication issue? Patient states this medication has been sent to wrong pharmacy and is having hard time getting them switched over. Requesting they are sent to:   [id number] [place], [place] - [address]

## 2022-10-22 ENCOUNTER — Encounter (HOSPITAL_COMMUNITY)
Admission: RE | Disposition: A | Discharge status: Home / Self Care | Payer: Self-pay | Source: Ambulatory Visit | Attending: Cardiovascular Disease

## 2022-10-22 ENCOUNTER — Ambulatory Visit (HOSPITAL_COMMUNITY)
Admission: RE | Admit: 2022-10-22 | Discharge: 2022-10-22 | Disposition: A | Discharge status: Home / Self Care | Payer: Medicare HMO | Source: Ambulatory Visit | Attending: Cardiovascular Disease | Admitting: Cardiovascular Disease

## 2022-10-22 ENCOUNTER — Other Ambulatory Visit (HOSPITAL_COMMUNITY): Payer: Self-pay

## 2022-10-22 ENCOUNTER — Other Ambulatory Visit: Payer: Self-pay

## 2022-10-22 DIAGNOSIS — I2584 Coronary atherosclerosis due to calcified coronary lesion: Secondary | ICD-10-CM | POA: Insufficient documentation

## 2022-10-22 DIAGNOSIS — Z87891 Personal history of nicotine dependence: Secondary | ICD-10-CM | POA: Insufficient documentation

## 2022-10-22 DIAGNOSIS — E119 Type 2 diabetes mellitus without complications: Secondary | ICD-10-CM | POA: Diagnosis not present

## 2022-10-22 DIAGNOSIS — E785 Hyperlipidemia, unspecified: Secondary | ICD-10-CM | POA: Insufficient documentation

## 2022-10-22 DIAGNOSIS — Z79899 Other long term (current) drug therapy: Secondary | ICD-10-CM | POA: Insufficient documentation

## 2022-10-22 DIAGNOSIS — Z955 Presence of coronary angioplasty implant and graft: Secondary | ICD-10-CM | POA: Insufficient documentation

## 2022-10-22 DIAGNOSIS — I25110 Atherosclerotic heart disease of native coronary artery with unstable angina pectoris: Secondary | ICD-10-CM | POA: Diagnosis not present

## 2022-10-22 DIAGNOSIS — I2510 Atherosclerotic heart disease of native coronary artery without angina pectoris: Secondary | ICD-10-CM | POA: Diagnosis present

## 2022-10-22 DIAGNOSIS — I10 Essential (primary) hypertension: Secondary | ICD-10-CM | POA: Insufficient documentation

## 2022-10-22 HISTORY — PX: CORONARY STENT INTERVENTION: CATH118234

## 2022-10-22 HISTORY — PX: LEFT HEART CATH AND CORONARY ANGIOGRAPHY: CATH118249

## 2022-10-22 HISTORY — PX: CORONARY LITHOTRIPSY: CATH118330

## 2022-10-22 HISTORY — PX: CORONARY ATHERECTOMY: CATH118238

## 2022-10-22 LAB — POCT ACTIVATED CLOTTING TIME
Activated Clotting Time: 348 seconds
Activated Clotting Time: 293 seconds

## 2022-10-22 SURGERY — LEFT HEART CATH AND CORONARY ANGIOGRAPHY
Anesthesia: LOCAL

## 2022-10-22 MED ORDER — LIDOCAINE HCL (PF) 1 % IJ SOLN
INTRAMUSCULAR | Status: AC
  Filled 2022-10-22: qty 30

## 2022-10-22 MED ORDER — SODIUM CHLORIDE 0.9% FLUSH: 3.0000 mL | INTRAVENOUS | Status: DC | PRN

## 2022-10-22 MED ORDER — NITROGLYCERIN 1 MG/10 ML FOR IR/CATH LAB
INTRA_ARTERIAL | Status: DC | PRN
  Administered 2022-10-22: 200 ug via INTRACORONARY

## 2022-10-22 MED ORDER — NITROGLYCERIN 1 MG/10 ML FOR IR/CATH LAB
INTRA_ARTERIAL | Status: AC
  Filled 2022-10-22: qty 10

## 2022-10-22 MED ORDER — FENTANYL CITRATE (PF) 100 MCG/2ML IJ SOLN
INTRAMUSCULAR | Status: DC | PRN
  Administered 2022-10-22: 25 ug via INTRAVENOUS
  Administered 2022-10-22: 50 ug via INTRAVENOUS
  Administered 2022-10-22: 25 ug via INTRAVENOUS

## 2022-10-22 MED ORDER — HYDRALAZINE HCL 20 MG/ML IJ SOLN: 10.0000 mg | INTRAMUSCULAR | Status: DC | PRN

## 2022-10-22 MED ORDER — SODIUM CHLORIDE 0.9 % WEIGHT BASED INFUSION
3.0000 mL/kg/h | INTRAVENOUS | Status: AC
  Administered 2022-10-22: 3 mL/kg/h via INTRAVENOUS

## 2022-10-22 MED ORDER — FENTANYL CITRATE (PF) 100 MCG/2ML IJ SOLN
INTRAMUSCULAR | Status: AC
  Filled 2022-10-22: qty 2

## 2022-10-22 MED ORDER — MIDAZOLAM HCL 2 MG/2ML IJ SOLN
INTRAMUSCULAR | Status: AC
  Filled 2022-10-22: qty 2

## 2022-10-22 MED ORDER — SODIUM CHLORIDE 0.9 % IV SOLN: 250.0000 mL | INTRAVENOUS | Status: DC | PRN

## 2022-10-22 MED ORDER — ACETAMINOPHEN 325 MG PO TABS: 650.0000 mg | ORAL_TABLET | ORAL | Status: DC | PRN

## 2022-10-22 MED ORDER — SODIUM CHLORIDE 0.9% FLUSH: 3.0000 mL | Freq: Two times a day (BID) | INTRAVENOUS | Status: DC

## 2022-10-22 MED ORDER — ONDANSETRON HCL 4 MG/2ML IJ SOLN: 4.0000 mg | Freq: Four times a day (QID) | INTRAMUSCULAR | Status: DC | PRN

## 2022-10-22 MED ORDER — HEPARIN SODIUM (PORCINE) 1000 UNIT/ML IJ SOLN
INTRAMUSCULAR | Status: AC
  Filled 2022-10-22: qty 10

## 2022-10-22 MED ORDER — VERAPAMIL HCL 2.5 MG/ML IV SOLN
INTRAVENOUS | Status: DC | PRN
  Administered 2022-10-22: 10 mL via INTRA_ARTERIAL

## 2022-10-22 MED ORDER — LABETALOL HCL 5 MG/ML IV SOLN: 10.0000 mg | INTRAVENOUS | Status: DC | PRN

## 2022-10-22 MED ORDER — LIDOCAINE HCL (PF) 1 % IJ SOLN
INTRAMUSCULAR | Status: DC | PRN
  Administered 2022-10-22: 2 mL via INTRADERMAL

## 2022-10-22 MED ORDER — CLOPIDOGREL BISULFATE 300 MG PO TABS
ORAL_TABLET | ORAL | Status: AC
  Filled 2022-10-22: qty 1

## 2022-10-22 MED ORDER — HEPARIN (PORCINE) IN NACL 1000-0.9 UT/500ML-% IV SOLN
INTRAVENOUS | Status: DC | PRN
  Administered 2022-10-22 (×2): 500 mL

## 2022-10-22 MED ORDER — CLOPIDOGREL BISULFATE 75 MG PO TABS
75.0000 mg | ORAL_TABLET | Freq: Every day | ORAL | 11 refills | Status: DC
  Filled 2022-10-22: qty 30, 30d supply, fill #0

## 2022-10-22 MED ORDER — VIPERSLIDE LUBRICANT OPTIME
TOPICAL | Status: DC | PRN
  Administered 2022-10-22: 20 mL via SURGICAL_CAVITY

## 2022-10-22 MED ORDER — PANTOPRAZOLE SODIUM 40 MG PO TBEC
40.0000 mg | DELAYED_RELEASE_TABLET | Freq: Every day | ORAL | 11 refills | Status: DC
  Filled 2022-10-22: qty 30, 30d supply, fill #0

## 2022-10-22 MED ORDER — ASPIRIN 81 MG PO CHEW: 81.0000 mg | CHEWABLE_TABLET | ORAL | Status: DC

## 2022-10-22 MED ORDER — FAMOTIDINE IN NACL 20-0.9 MG/50ML-% IV SOLN
INTRAVENOUS | Status: AC | PRN
  Administered 2022-10-22: 20 mg via INTRAVENOUS

## 2022-10-22 MED ORDER — VERAPAMIL HCL 2.5 MG/ML IV SOLN
INTRAVENOUS | Status: AC
  Filled 2022-10-22: qty 2

## 2022-10-22 MED ORDER — CLOPIDOGREL BISULFATE 75 MG PO TABS: 75.0000 mg | ORAL_TABLET | Freq: Every day | ORAL | Status: DC

## 2022-10-22 MED ORDER — HEPARIN (PORCINE) IN NACL 1000-0.9 UT/500ML-% IV SOLN
INTRAVENOUS | Status: AC
  Filled 2022-10-22: qty 1000

## 2022-10-22 MED ORDER — CLOPIDOGREL BISULFATE 300 MG PO TABS
ORAL_TABLET | ORAL | Status: DC | PRN
  Administered 2022-10-22: 600 mg via ORAL

## 2022-10-22 MED ORDER — IOHEXOL 350 MG/ML SOLN
INTRAVENOUS | Status: DC | PRN
  Administered 2022-10-22: 185 mL

## 2022-10-22 MED ORDER — SODIUM CHLORIDE 0.9 % IV SOLN: INTRAVENOUS | Status: DC

## 2022-10-22 MED ORDER — SODIUM CHLORIDE 0.9 % WEIGHT BASED INFUSION: 1.0000 mL/kg/h | INTRAVENOUS | Status: DC

## 2022-10-22 MED ORDER — MIDAZOLAM HCL 2 MG/2ML IJ SOLN
INTRAMUSCULAR | Status: DC | PRN
  Administered 2022-10-22: 1 mg via INTRAVENOUS
  Administered 2022-10-22: 2 mg via INTRAVENOUS

## 2022-10-22 MED ORDER — HEPARIN SODIUM (PORCINE) 1000 UNIT/ML IJ SOLN
INTRAMUSCULAR | Status: DC | PRN
  Administered 2022-10-22: 5500 [IU] via INTRAVENOUS
  Administered 2022-10-22: 4500 [IU] via INTRAVENOUS

## 2022-10-22 MED ORDER — FAMOTIDINE IN NACL 20-0.9 MG/50ML-% IV SOLN
INTRAVENOUS | Status: AC
  Filled 2022-10-22: qty 50

## 2022-10-22 SURGICAL SUPPLY — 23 items
BALL SAPPHIRE NC24 3.25X15 (BALLOONS) ×1
BALL SAPPHIRE NC24 4.0X8 (BALLOONS) ×1
BALLN EMERGE MR 2.5X20 (BALLOONS) ×1
BALLOON EMERGE MR 2.5X20 (BALLOONS) IMPLANT
BALLOON SAPPHIRE NC24 3.25X15 (BALLOONS) IMPLANT
BALLOON SAPPHIRE NC24 4.0X8 (BALLOONS) IMPLANT
CATH 5FR JL3.5 JR4 ANG PIG MP (CATHETERS) IMPLANT
CATH SHOCKWAVE C2 3.0X12 (CATHETERS) IMPLANT
CATH VISTA GUIDE 6FR XBLAD3.5 (CATHETERS) IMPLANT
CROWN DIAMONDBACK CLASSIC 1.25 (BURR) IMPLANT
DEVICE RAD COMP TR BAND LRG (VASCULAR PRODUCTS) IMPLANT
GLIDESHEATH SLEND SS 6F .021 (SHEATH) IMPLANT
GUIDEWIRE INQWIRE 1.5J.035X260 (WIRE) IMPLANT
INQWIRE 1.5J .035X260CM (WIRE) ×1
KIT ENCORE 26 ADVANTAGE (KITS) IMPLANT
KIT HEART LEFT (KITS) ×1 IMPLANT
LUBRICANT VIPERSLIDE CORONARY (MISCELLANEOUS) IMPLANT
PACK CARDIAC CATHETERIZATION (CUSTOM PROCEDURE TRAY) ×1 IMPLANT
STENT SYNERGY XD 3.0X32 (Permanent Stent) IMPLANT
TRANSDUCER W/STOPCOCK (MISCELLANEOUS) ×1 IMPLANT
TUBING CIL FLEX 10 FLL-RA (TUBING) ×1 IMPLANT
WIRE COUGAR XT STRL 190CM (WIRE) IMPLANT
WIRE VIPERWIRE COR FLEX .012 (WIRE) IMPLANT

## 2022-10-22 NOTE — Discharge Summary (Signed)
Discharge Summary for Same Day PCI   Patient ID: [name] MRN: [id number]; DOB: Jan 05, 1956  Admit date: 10/22/2022 Discharge date: 10/22/2022  Primary Care Provider: Street, Denise Mt, MD  Primary Cardiologist: Denise Campus, MD  Primary Electrophysiologist:  None   Discharge Diagnoses    Active Problems:   Coronary artery disease abnormal coronary CT angio   Diagnostic Studies/Procedures    Cardiac Catheterization 10/22/2022:    Prox RCA to Mid RCA lesion is 40% stenosed.   1st Mrg lesion is 40% stenosed.   Prox LAD to Mid LAD lesion is 90% stenosed.   A drug-eluting stent was successfully placed using a STENT SYNERGY XD 3.0X32.   Post intervention, there is a 0% residual stenosis.   The left ventricular systolic function is normal.   The left ventricular ejection fraction is greater than 65% by visual estimate.   There is no mitral valve regurgitation.   Severe, heavily calcified stenosis in the mid LAD Successful PTCA/orbital atherectomy/intracoronary lithotripsy/DES x 1 mid LAD Mild non-obstructive disease in the obtuse marginal branch Moderate caliber dominant RCA with moderate mid vessel stenosis. This does not appear to be flow limiting.  Normal LV systolic function   Recommendations: Same day post PCI discharge. ASA and Plavix for six months.   Diagnostic Dominance: Right  Intervention   _____________   History of Present Illness     [name] is a 66 y.o. female with past medical history of COPD, hypertension, GERD who was referred to cardiology for symptoms of dizziness and atypical chest pain.  She was seen in the office with Dr. Agustin Rivers and recommended to have a coronary CT angio completed which showed multiple abnormalities including severe stenosis in mid LAD, large D1 with heavy calcification of 50 to 69% stenosis in proximal portion, large OM with heavy calcification of 50 to 69% in proximal portion and RCA with multiple  moderate stenosis.  Given this finding she was placed on aspirin, statin and Imdur with recommendations for outpatient cardiac catheterization.  Hospital Course     The patient underwent cardiac cath as noted above with severe heavily calcified stenosis in mid LAD treated with orbital atherectomy, lithotripsy and DES x1.  Mild nonobstructive disease in OM branch to be treated medically. Plan for DAPT with ASA/Plavix for at least 6 months. The patient was seen by cardiac rehab while in short stay. There were no observed complications post cath. Radial cath site was re-evaluated prior to discharge and found to be stable without any complications. Instructions/precautions regarding cath site care were given prior to discharge.  [name] was seen by Dr. Angelena Rivers and determined stable for discharge home. Follow up with our office has been arranged. Medications are listed below. Pertinent changes include Plavix.  _____________  Cath/PCI Registry Performance & Quality Measures: Aspirin prescribed? - Yes ADP Receptor Inhibitor (Plavix/Clopidogrel, Brilinta/Ticagrelor or Effient/Prasugrel) prescribed (includes medically managed patients)? - Yes High Intensity Statin (Lipitor 40-53m or Crestor 20-428m prescribed? - Yes For EF <40%, was ACEI/ARB prescribed? - Not Applicable (EF >/= 4064%For EF <40%, Aldosterone Antagonist (Spironolactone or Eplerenone) prescribed? - Not Applicable (EF >/= 4015%Cardiac Rehab Phase II ordered (Included Medically managed Patients)? - Yes  _____________   Discharge Vitals Blood pressure (!) 148/63, pulse 61, temperature 97.8 F (36.6 C), temperature source Temporal, resp. rate 16, height 5' 1.5" (1.562 m), weight 88 kg, SpO2 97 %.  Filed Weights   10/22/22 0806  Weight: 88 kg    Last  Labs & Radiologic Studies    CBC No results for input(s): "WBC", "NEUTROABS", "HGB", "HCT", "MCV", "PLT" in the last 72 hours. Basic Metabolic Panel No results for input(s):  "NA", "K", "CL", "CO2", "GLUCOSE", "BUN", "CREATININE", "CALCIUM", "MG", "PHOS" in the last 72 hours. Liver Function Tests No results for input(s): "AST", "ALT", "ALKPHOS", "BILITOT", "PROT", "ALBUMIN" in the last 72 hours. No results for input(s): "LIPASE", "AMYLASE" in the last 72 hours. High Sensitivity Troponin:   No results for input(s): "TROPONINIHS" in the last 720 hours.  BNP Invalid input(s): "POCBNP" D-Dimer No results for input(s): "DDIMER" in the last 72 hours. Hemoglobin A1C No results for input(s): "HGBA1C" in the last 72 hours. Fasting Lipid Panel No results for input(s): "CHOL", "HDL", "LDLCALC", "TRIG", "CHOLHDL", "LDLDIRECT" in the last 72 hours. Thyroid Function Tests No results for input(s): "TSH", "T4TOTAL", "T3FREE", "THYROIDAB" in the last 72 hours.  Invalid input(s): "FREET3" _____________  CARDIAC CATHETERIZATION  Result Date: 10/22/2022   Prox RCA to Mid RCA lesion is 40% stenosed.   1st Mrg lesion is 40% stenosed.   Prox LAD to Mid LAD lesion is 90% stenosed.   A drug-eluting stent was successfully placed using a STENT SYNERGY XD 3.0X32.   Post intervention, there is a 0% residual stenosis.   The left ventricular systolic function is normal.   The left ventricular ejection fraction is greater than 65% by visual estimate.   There is no mitral valve regurgitation. Severe, heavily calcified stenosis in the mid LAD Successful PTCA/orbital atherectomy/intracoronary lithotripsy/DES x 1 mid LAD Mild non-obstructive disease in the obtuse marginal branch Moderate caliber dominant RCA with moderate mid vessel stenosis. This does not appear to be flow limiting. Normal LV systolic function Recommendations: Same day post PCI discharge. ASA and Plavix for six months.   VAS US CAROTID  Result Date: 10/10/2022 Carotid Arterial Duplex Study Patient Name:  [name]  Date of Exam:   10/10/2022 Medical Rec #: [id number]        Accession #:    [id number] Date of Birth: [birth year]         Patient Gender: F Patient Age:   16 years Exam Location:  [place] Procedure:      VAS US CAROTID Referring Phys: [name] --------------------------------------------------------------------------------  Indications: Left carotid bruit [R09.89 (ICD-10-CM)]. Performing Technologist: Luane School RDCS  Examination Guidelines: A complete evaluation includes B-mode imaging, spectral Doppler, color Doppler, and power Doppler as needed of all accessible portions of each vessel. Bilateral testing is considered an integral part of a complete examination. Limited examinations for reoccurring indications may be performed as noted.  Right Carotid Findings: +----------+--------+--------+--------+------------------+--------+           PSV cm/sEDV cm/sStenosisPlaque DescriptionComments +----------+--------+--------+--------+------------------+--------+ CCA Prox  63      12                                         +----------+--------+--------+--------+------------------+--------+ CCA Distal56      16                                         +----------+--------+--------+--------+------------------+--------+ ICA Prox  86      17              heterogenous               +----------+--------+--------+--------+------------------+--------+  ICA Mid   108     22                                         +----------+--------+--------+--------+------------------+--------+ ICA Distal107     24                                         +----------+--------+--------+--------+------------------+--------+ ECA       141     20                                         +----------+--------+--------+--------+------------------+--------+ +----------+--------+-------+----------------+-------------------+           PSV cm/sEDV cmsDescribe        Arm Pressure (mmHG) +----------+--------+-------+----------------+-------------------+ [id number]            Multiphasic, WNL                     +----------+--------+-------+----------------+-------------------+ +---------+--------+--+--------+--+---------+ VertebralPSV cm/s53EDV cm/s13Antegrade +---------+--------+--+--------+--+---------+  Left Carotid Findings: +----------+--------+--------+--------+------------------+--------+           PSV cm/sEDV cm/sStenosisPlaque DescriptionComments +----------+--------+--------+--------+------------------+--------+ CCA Prox  96      14                                         +----------+--------+--------+--------+------------------+--------+ CCA Distal59      12                                         +----------+--------+--------+--------+------------------+--------+ ICA Prox  103     28              heterogenous               +----------+--------+--------+--------+------------------+--------+ ICA Mid   104     28                                         +----------+--------+--------+--------+------------------+--------+ ICA Distal117     24                                         +----------+--------+--------+--------+------------------+--------+ ECA       250     12      >50%    heterogenous               +----------+--------+--------+--------+------------------+--------+ +----------+--------+--------+----------------+-------------------+           PSV cm/sEDV cm/sDescribe        Arm Pressure (mmHG) +----------+--------+--------+----------------+-------------------+ [id number]             Multiphasic, WNL                    +----------+--------+--------+----------------+-------------------+ +---------+--------+--------+---------+ VertebralPSV cm/sEDV cm/sAntegrade +---------+--------+--------+---------+   Summary: Right Carotid: There is no evidence of stenosis in the right ICA. Left Carotid: There is no evidence of  stenosis in the left ICA. The ECA appears               >50% stenosed. Vertebrals:  Bilateral vertebral arteries  demonstrate antegrade flow. Subclavians: Normal flow hemodynamics were seen in bilateral subclavian              arteries. *See table(s) above for measurements and observations.  Electronically signed by Denise Campus MD on 10/10/2022 at 5:23:27 PM.    Final    ECHOCARDIOGRAM COMPLETE  Result Date: 10/10/2022    ECHOCARDIOGRAM REPORT   Patient Name:   TAELYNN MCELHANNON Date of Exam: 10/10/2022 Medical Rec #:  [id number]       Height:       62.0 in Accession #:    [id number]      Weight:       197.4 lb Date of Birth:  1956/08/26       BSA:          1.901 m Patient Age:    62 years        BP:           180/94 mmHg Patient Gender: F               HR:           58 bpm. Exam Location:  [place] Procedure: 2D Echo, Cardiac Doppler, Color Doppler and Strain Analysis Indications:    Dyspnea R06.00  History:        Patient has no prior history of Echocardiogram examinations.  Sonographer:    Luane School RDCS Referring Phys: [phone] Ashley  1. GLS -20.7. Left ventricular ejection fraction, by estimation, is 60 to 65%. The left ventricle has normal function. The left ventricle has no regional wall motion abnormalities. Left ventricular diastolic parameters are consistent with Grade II diastolic dysfunction (pseudonormalization).  2. Right ventricular systolic function is normal. The right ventricular size is normal. There is normal pulmonary artery systolic pressure.  3. The mitral valve is normal in structure. Mild mitral valve regurgitation. No evidence of mitral stenosis.  4. The aortic valve is normal in structure. Aortic valve regurgitation is not visualized. No aortic stenosis is present.  5. The inferior vena cava is normal in size with greater than 50% respiratory variability, suggesting right atrial pressure of 3 mmHg. FINDINGS  Left Ventricle: GLS -20.7. Left ventricular ejection fraction, by estimation, is 60 to 65%. The left ventricle has normal function. The left ventricle has no regional  wall motion abnormalities. The left ventricular internal cavity size was normal in size. There is borderline left ventricular hypertrophy. Left ventricular diastolic parameters are consistent with Grade II diastolic dysfunction (pseudonormalization). Right Ventricle: The right ventricular size is normal. No increase in right ventricular wall thickness. Right ventricular systolic function is normal. There is normal pulmonary artery systolic pressure. The tricuspid regurgitant velocity is 2.46 m/s, and  with an assumed right atrial pressure of 3 mmHg, the estimated right ventricular systolic pressure is 83.3 mmHg. Left Atrium: Left atrial size was normal in size. Right Atrium: Right atrial size was normal in size. Pericardium: There is no evidence of pericardial effusion. Mitral Valve: The mitral valve is normal in structure. Mild mitral valve regurgitation. No evidence of mitral valve stenosis. Tricuspid Valve: The tricuspid valve is normal in structure. Tricuspid valve regurgitation is trivial. No evidence of tricuspid stenosis. Aortic Valve: The aortic valve is normal in structure. Aortic valve regurgitation is not visualized. No aortic stenosis is present.  Pulmonic Valve: The pulmonic valve was normal in structure. Pulmonic valve regurgitation is not visualized. No evidence of pulmonic stenosis. Aorta: The aortic root is normal in size and structure. Venous: The inferior vena cava is normal in size with greater than 50% respiratory variability, suggesting right atrial pressure of 3 mmHg. IAS/Shunts: No atrial level shunt detected by color flow Doppler.  LEFT VENTRICLE PLAX 2D LVIDd:         4.50 cm   Diastology LVIDs:         2.70 cm   LV e' medial:    6.64 cm/s LV PW:         1.10 cm   LV E/e' medial:  19.9 LV IVS:        1.10 cm   LV e' lateral:   7.72 cm/s LVOT diam:     2.00 cm   LV E/e' lateral: 17.1 LV SV:         98 LV SV Index:   51 LVOT Area:     3.14 cm  RIGHT VENTRICLE             IVC RV S prime:      17.00 cm/s  IVC diam: 2.00 cm TAPSE (M-mode): 3.0 cm LEFT ATRIUM             Index        RIGHT ATRIUM           Index LA diam:        4.30 cm 2.26 cm/m   RA Area:     13.90 cm LA Vol (A2C):   57.2 ml 30.09 ml/m  RA Volume:   32.90 ml  17.31 ml/m LA Vol (A4C):   52.0 ml 27.35 ml/m LA Biplane Vol: 54.6 ml 28.72 ml/m  AORTIC VALVE LVOT Vmax:   120.00 cm/s LVOT Vmean:  85.300 cm/s LVOT VTI:    0.311 m  AORTA Ao Root diam: 2.90 cm Ao Asc diam:  3.10 cm Ao Desc diam: 2.20 cm MITRAL VALVE                TRICUSPID VALVE MV Area (PHT): 3.87 cm     TR Peak grad:   24.2 mmHg MV Decel Time: 196 msec     TR Vmax:        246.00 cm/s MV E velocity: 132.00 cm/s MV A velocity: 121.00 cm/s  SHUNTS MV E/A ratio:  1.09         Systemic VTI:  0.31 m                             Systemic Diam: 2.00 cm Denise Campus MD Electronically signed by Denise Campus MD Signature Date/Time: 10/10/2022/5:04:11 PM    Final     Disposition   Pt is being discharged home today in good condition.  Follow-up Plans & Appointments     Follow-up Information     Park Liter, MD Follow up.   Specialty: Cardiology Why: Office will call you for a follow up appt Contact information: [place] [address] [phone]                Discharge Instructions     Amb Referral to Cardiac Rehabilitation   Complete by: As directed    Diagnosis: Coronary Stents   After initial evaluation and assessments completed: Virtual Based Care may be provided alone or in conjunction with Phase 2  Cardiac Rehab based on patient barriers.: Yes   Intensive Cardiac Rehabilitation (ICR) [place] location only OR Traditional Cardiac Rehabilitation (TCR) *If criteria for ICR are not met will enroll in TCR [facility] only): Yes        Discharge Medications   Allergies as of 10/22/2022       Reactions   Avelox [moxifloxacin Hcl In Nacl] Swelling, Rash   Tongue swelling   Ceclor [cefaclor] Swelling, Rash   Tongue swelling    Codeine Anaphylaxis, Swelling, Rash   Tongue swelling   Penicillins Anaphylaxis, Rash, Other (See Comments)   Tongue swelling   Lunesta [eszopiclone] Other (See Comments)   Leaves a "metal taste" in mouth   Zocor [simvastatin] Other (See Comments)   Bones Ache         Medication List     STOP taking these medications    isosorbide mononitrate 30 MG 24 hr tablet Commonly known as: IMDUR   lansoprazole 30 MG capsule Commonly known as: PREVACID       TAKE these medications    acetaminophen 325 MG tablet Commonly known as: TYLENOL Take 2 tablets (650 mg total) by mouth every 6 (six) hours as needed for mild pain (or Fever >/= 101).   albuterol 108 (90 Base) MCG/ACT inhaler Commonly known as: VENTOLIN HFA Inhale 2 puffs into the lungs every 6 (six) hours as needed for wheezing or shortness of breath.   Allergy Relief 10 MG tablet Generic drug: loratadine Take 10 mg by mouth daily as needed for allergies.   ALPRAZolam 0.5 MG tablet Commonly known as: XANAX Take 0.5 mg by mouth 3 (three) times daily as needed for anxiety.   amLODipine 10 MG tablet Commonly known as: NORVASC Take 10 mg by mouth daily.   aspirin EC 81 MG tablet Take 81 mg by mouth daily. Swallow whole.   clopidogrel 75 MG tablet Commonly known as: PLAVIX Take 1 tablet (75 mg total) by mouth daily with breakfast. Start taking on: October 23, 2022 Notes to patient: Next dose 11/21   ezetimibe 10 MG tablet Commonly known as: ZETIA Take 10 mg by mouth daily.   hydrochlorothiazide 12.5 MG tablet Commonly known as: HYDRODIURIL Take 12.5 mg by mouth every other day.   lisinopril 40 MG tablet Commonly known as: ZESTRIL Take 40 mg by mouth at bedtime.   meclizine 25 MG tablet Commonly known as: ANTIVERT Take 25 mg by mouth 3 (three) times daily as needed for dizziness.   metoprolol succinate 25 MG 24 hr tablet Commonly known as: TOPROL-XL Take 1 tablet (25 mg total) by mouth daily.    nitroGLYCERIN 0.4 MG SL tablet Commonly known as: NITROSTAT Place 1 tablet (0.4 mg total) under the tongue every 5 (five) minutes as needed for chest pain.   pantoprazole 40 MG tablet Commonly known as: Protonix Take 1 tablet (40 mg total) by mouth daily. Start taking on: October 23, 2022 Notes to patient: Next dose 11/21   rosuvastatin 20 MG tablet Commonly known as: CRESTOR Take 1 tablet (20 mg total) by mouth daily.           Allergies Allergies  Allergen Reactions   Avelox [Moxifloxacin Hcl In Nacl] Swelling and Rash    Tongue swelling   Ceclor [Cefaclor] Swelling and Rash    Tongue swelling    Codeine Anaphylaxis, Swelling and Rash    Tongue swelling    Penicillins Anaphylaxis, Rash and Other (See Comments)    Tongue swelling    Johnnye Sima [  Eszopiclone] Other (See Comments)    Leaves a "metal taste" in mouth   Zocor [Simvastatin] Other (See Comments)    Bones Ache     Outstanding Labs/Studies   N/a   Duration of Discharge Encounter   Greater than 30 minutes including physician time.  Signed, Reino Bellis, NP 10/22/2022, 3:04 PM

## 2022-10-22 NOTE — Interval H&P Note (Signed)
History and Physical Interval Note:  10/22/2022 8:59 AM  [name]  has presented today for surgery, with the diagnosis of abnormal ct.  The various methods of treatment have been discussed with the patient and family. After consideration of risks, benefits and other options for treatment, the patient has consented to  Procedure(s): LEFT HEART CATH AND CORONARY ANGIOGRAPHY (N/A) as a surgical intervention.  The patient's history has been reviewed, patient examined, no change in status, stable for surgery.  I have reviewed the patient's chart and labs.  Questions were answered to the patient's satisfaction.    Cath Lab Visit (complete for each Cath Lab visit)  Clinical Evaluation Leading to the Procedure:   ACS: No.  Non-ACS:    Anginal Classification: CCS III  Anti-ischemic medical therapy: Maximal Therapy (2 or more classes of medications)  Non-Invasive Test Results: Coronary CTA with three vessel obstructive CAD  Prior CABG: No previous CABG        Lauree Chandler

## 2022-10-22 NOTE — Progress Notes (Signed)
CARDIAC REHAB PHASE I       Post cath/stent education including site care, restrictions,exercise guidelines, heart healthy diet, risk factors, antiplatelet therapy importance and CRP2 reviewed. All questions and concerns addressed. Will refer to [facility] for CRP2. Plan for home today.    1315-1400 Vanessa Barbara, RN BSN 10/22/2022 1:52 PM

## 2022-10-23 ENCOUNTER — Encounter (HOSPITAL_COMMUNITY): Payer: Self-pay | Admitting: Cardiovascular Disease

## 2022-10-24 ENCOUNTER — Telehealth (HOSPITAL_COMMUNITY): Payer: Self-pay

## 2022-10-24 NOTE — Telephone Encounter (Signed)
Per phase I cardiac rehab, fax referral to [facility] cardiac rehab.

## 2022-10-31 DIAGNOSIS — I2510 Atherosclerotic heart disease of native coronary artery without angina pectoris: Secondary | ICD-10-CM | POA: Diagnosis not present

## 2022-10-31 DIAGNOSIS — Z79899 Other long term (current) drug therapy: Secondary | ICD-10-CM | POA: Diagnosis not present

## 2022-10-31 DIAGNOSIS — I10 Essential (primary) hypertension: Secondary | ICD-10-CM | POA: Diagnosis not present

## 2022-10-31 DIAGNOSIS — Z955 Presence of coronary angioplasty implant and graft: Secondary | ICD-10-CM | POA: Diagnosis not present

## 2022-11-02 DIAGNOSIS — Z955 Presence of coronary angioplasty implant and graft: Secondary | ICD-10-CM | POA: Diagnosis not present

## 2022-11-05 DIAGNOSIS — Z955 Presence of coronary angioplasty implant and graft: Secondary | ICD-10-CM | POA: Diagnosis not present

## 2022-11-07 DIAGNOSIS — Z955 Presence of coronary angioplasty implant and graft: Secondary | ICD-10-CM | POA: Diagnosis not present

## 2022-11-09 ENCOUNTER — Telehealth: Payer: Self-pay | Admitting: Cardiology

## 2022-11-09 ENCOUNTER — Ambulatory Visit: Payer: Medicare HMO | Admitting: Cardiology

## 2022-11-09 ENCOUNTER — Other Ambulatory Visit: Payer: Self-pay

## 2022-11-09 MED ORDER — CLOPIDOGREL BISULFATE 75 MG PO TABS: 75.0000 mg | ORAL_TABLET | Freq: Every day | ORAL | 3 refills | Status: DC

## 2022-11-09 MED ORDER — PANTOPRAZOLE SODIUM 40 MG PO TBEC: 40.0000 mg | DELAYED_RELEASE_TABLET | Freq: Every day | ORAL | 3 refills | Status: DC

## 2022-11-09 NOTE — Telephone Encounter (Signed)
*  STAT* If patient is at the pharmacy, call can be transferred to refill team.   1. Which medications need to be refilled? (please list name of each medication and dose if known)  pantoprazole (PROTONIX) 40 MG tablet   clopidogrel (PLAVIX) 75 MG tablet  2. Which pharmacy/location (including street and city if local pharmacy) is medication to be sent to? [id number]- [place], [place] - [id number]    3. Do they need a 30 day or 90 day supply?  90 day supply

## 2022-11-09 NOTE — Telephone Encounter (Signed)
Protonix #90 3 ref, Plavix '75mg'$  #90 ref x 3, sent to [facility]

## 2022-11-27 DIAGNOSIS — Z955 Presence of coronary angioplasty implant and graft: Secondary | ICD-10-CM | POA: Diagnosis not present

## 2022-11-28 DIAGNOSIS — Z955 Presence of coronary angioplasty implant and graft: Secondary | ICD-10-CM | POA: Diagnosis not present

## 2022-11-29 ENCOUNTER — Ambulatory Visit: Payer: Medicare Other | Admitting: Cardiology

## 2022-11-30 DIAGNOSIS — Z955 Presence of coronary angioplasty implant and graft: Secondary | ICD-10-CM | POA: Diagnosis not present

## 2022-12-04 DIAGNOSIS — I25119 Atherosclerotic heart disease of native coronary artery with unspecified angina pectoris: Secondary | ICD-10-CM | POA: Diagnosis not present

## 2022-12-04 DIAGNOSIS — Z0000 Encounter for general adult medical examination without abnormal findings: Secondary | ICD-10-CM | POA: Diagnosis not present

## 2022-12-04 DIAGNOSIS — Z9181 History of falling: Secondary | ICD-10-CM | POA: Diagnosis not present

## 2022-12-04 DIAGNOSIS — Z6837 Body mass index (BMI) 37.0-37.9, adult: Secondary | ICD-10-CM | POA: Diagnosis not present

## 2022-12-04 DIAGNOSIS — R69 Illness, unspecified: Secondary | ICD-10-CM | POA: Diagnosis not present

## 2022-12-04 DIAGNOSIS — T466X5A Adverse effect of antihyperlipidemic and antiarteriosclerotic drugs, initial encounter: Secondary | ICD-10-CM | POA: Diagnosis not present

## 2022-12-04 DIAGNOSIS — E785 Hyperlipidemia, unspecified: Secondary | ICD-10-CM | POA: Diagnosis not present

## 2022-12-04 DIAGNOSIS — Z1339 Encounter for screening examination for other mental health and behavioral disorders: Secondary | ICD-10-CM | POA: Diagnosis not present

## 2022-12-04 DIAGNOSIS — Z955 Presence of coronary angioplasty implant and graft: Secondary | ICD-10-CM | POA: Diagnosis not present

## 2022-12-04 DIAGNOSIS — G720 Drug-induced myopathy: Secondary | ICD-10-CM | POA: Diagnosis not present

## 2022-12-04 DIAGNOSIS — I10 Essential (primary) hypertension: Secondary | ICD-10-CM | POA: Diagnosis not present

## 2022-12-05 DIAGNOSIS — Z955 Presence of coronary angioplasty implant and graft: Secondary | ICD-10-CM | POA: Diagnosis not present

## 2022-12-20 ENCOUNTER — Ambulatory Visit: Payer: Medicare HMO | Attending: Cardiology | Admitting: Cardiology

## 2022-12-24 ENCOUNTER — Encounter: Payer: Self-pay | Admitting: Cardiology

## 2023-01-15 ENCOUNTER — Ambulatory Visit: Payer: Medicare HMO | Admitting: Cardiology

## 2023-04-03 ENCOUNTER — Ambulatory Visit: Payer: Medicare HMO | Attending: Cardiology | Admitting: Cardiology

## 2023-04-03 ENCOUNTER — Encounter: Payer: Self-pay | Admitting: Cardiology

## 2023-04-03 VITALS — BP 138/74 | HR 70 | Ht 61.25 in | Wt 220.2 lb

## 2023-04-03 DIAGNOSIS — I10 Essential (primary) hypertension: Secondary | ICD-10-CM

## 2023-04-03 DIAGNOSIS — E785 Hyperlipidemia, unspecified: Secondary | ICD-10-CM | POA: Diagnosis not present

## 2023-04-03 DIAGNOSIS — I2510 Atherosclerotic heart disease of native coronary artery without angina pectoris: Secondary | ICD-10-CM

## 2023-04-03 DIAGNOSIS — R0989 Other specified symptoms and signs involving the circulatory and respiratory systems: Secondary | ICD-10-CM | POA: Diagnosis not present

## 2023-04-03 DIAGNOSIS — I209 Angina pectoris, unspecified: Secondary | ICD-10-CM

## 2023-04-03 NOTE — Addendum Note (Signed)
Addended by: Baldo Ash D on: 04/03/2023 01:48 PM   Modules accepted: Orders

## 2023-04-03 NOTE — Patient Instructions (Signed)
Medication Instructions:  Your physician recommends that you continue on your current medications as directed. Please refer to the Current Medication list given to you today.  *If you need a refill on your cardiac medications before your next appointment, please call your pharmacy*   Lab Work: Your physician recommends that you return for lab work in: when fasting You need to have labs done when you are fasting.  You can come Monday through Friday 8:30 am to 12:00 pm and 1:15 to 4:30. You do not need to make an appointment as the order has already been placed. The labs you are going to have done are  Lipids.    Testing/Procedures: Your physician has requested that you have a carotid duplex. This test is an ultrasound of the carotid arteries in your neck. It looks at blood flow through these arteries that supply the brain with blood. Allow one hour for this exam. There are no restrictions or special instructions.    Follow-Up: At [facility], you and your health needs are our priority.  As part of our continuing mission to provide you with exceptional heart care, we have created designated Provider Care Teams.  These Care Teams include your primary Cardiologist (physician) and Advanced Practice Providers (APPs -  Physician Assistants and Nurse Practitioners) who all work together to provide you with the care you need, when you need it.  We recommend signing up for the patient portal called "MyChart".  Sign up information is provided on this After Visit Summary.  MyChart is used to connect with patients for Virtual Visits (Telemedicine).  Patients are able to view lab/test results, encounter notes, upcoming appointments, etc.  Non-urgent messages can be sent to your provider as well.   To learn more about what you can do with MyChart, go to [email or web address].    Your next appointment:   6 month(s)  The format for your next appointment:   In Person  Provider:   Gypsy Balsam, MD     Other Instructions NA

## 2023-04-03 NOTE — Progress Notes (Signed)
Cardiology Office Note:    Date:  04/03/2023   ID:  [name], DOB Aug 06, 1956, MRN [id number]  PCP:  Street, Stephanie Coup, MD  Cardiologist:  Gypsy Balsam, MD    Referring MD: Street, Stephanie Coup, *   Chief Complaint  Patient presents with   Medication Management    Metoprolol, possible low HR    History of Present Illness:    [name] is a 67 y.o. female with past medical history significant for coronary artery disease.  At the end of last year she got coronary CT angio done which showed severe stenosis of mid LAD heavy calcification of the LAD diagonal branch with 50 to 69% stenosis in the proximal portion, large obtuse marginal branch also have heavy calcification with 50 to 69% stenosis as well as right coronary artery had 50 to 69% stenosis.  She did have cardiac catheterization done which showed severe stenosis of mid LAD.  That was dressed with rotatory atherectomy and drug-eluting stenting.  She was put on dual antiplatelet therapy, sadly, 2 antiplatelet therapy has been interrupted about 2 months after she initiated this because of bruising on her arms.  Now she takes only Plavix.  Likely she is doing very well.  Denies have any chest pain tightness squeezing pressure burning chest.  She told me that almost immediately after that intervention she started feeling better.  Additional problem include dyslipidemia, history of smoking likely she still abstain from smoking.  She is doing overall very well  Past Medical History:  Diagnosis Date   Anxiety    Claustrophobia    COPD (chronic obstructive pulmonary disease) (HCC)    Deafness    right ear   Depression    Facet arthritis of lumbar region    GERD (gastroesophageal reflux disease)    Hypertension, essential, benign    Meniere disease    PONV (postoperative nausea and vomiting)    Primary localized osteoarthritis of left knee    Severe episode of recurrent major depressive disorder, without psychotic  features (HCC)    Sleep apnea    moderate; does not wear CPAP    Urinary incontinence    has increased urination at bedtime; pt wears pads at bedtime, but still wets the bed    Past Surgical History:  Procedure Laterality Date   ABDOMINAL HYSTERECTOMY     BLADDER SUSPENSION     CARPAL TUNNEL RELEASE Left 2003   Performed by Dr. Doristine Section   CESAREAN SECTION     CORONARY ATHERECTOMY N/A 10/22/2022   Procedure: CORONARY ATHERECTOMY;  Surgeon: Kathleene Hazel, MD;  [facility];  Service: Cardiovascular;  Laterality: N/A;   CORONARY LITHOTRIPSY N/A 10/22/2022   Procedure: CORONARY LITHOTRIPSY;  Surgeon: Kathleene Hazel, MD;  [facility]: [facility];  Service: Cardiovascular;  Laterality: N/A;   CORONARY STENT INTERVENTION N/A 10/22/2022   Procedure: CORONARY STENT INTERVENTION;  Surgeon: Kathleene Hazel, MD;  [facility]: [facility];  Service: Cardiovascular;  Laterality: N/A;   EYE SURGERY     melanoma removed from right eye   KNEE ARTHROSCOPY Left 12/03/2004   LEFT HEART CATH AND CORONARY ANGIOGRAPHY N/A 10/22/2022   Procedure: LEFT HEART CATH AND CORONARY ANGIOGRAPHY;  Surgeon: Kathleene Hazel, MD;  [facility];  Service: Cardiovascular;  Laterality: N/A;   TOTAL KNEE ARTHROPLASTY Left 02/07/2015   dr Thurston Hole   TOTAL KNEE ARTHROPLASTY Left 02/07/2015   Procedure: LEFT TOTAL KNEE ARTHROPLASTY;  Surgeon: Salvatore Marvel, MD;  Location: [facility];  Service: Orthopedics;  Laterality: Left;    Current Medications: Current Meds  Medication Sig   acetaminophen (TYLENOL) 325 MG tablet Take 2 tablets (650 mg total) by mouth every 6 (six) hours as needed for mild pain (or Fever >/= 101).   albuterol (VENTOLIN HFA) 108 (90 Base) MCG/ACT inhaler Inhale 2 puffs into the lungs every 6 (six) hours as needed for wheezing or shortness of breath.   ALPRAZolam (XANAX) 0.5 MG tablet Take 0.5 mg by mouth 3 (three) times daily as  needed for anxiety.   amLODipine (NORVASC) 10 MG tablet Take 10 mg by mouth daily.   clopidogrel (PLAVIX) 75 MG tablet Take 1 tablet (75 mg total) by mouth daily with breakfast.   ezetimibe (ZETIA) 10 MG tablet Take 10 mg by mouth daily.   lisinopril (ZESTRIL) 40 MG tablet Take 40 mg by mouth at bedtime.   loratadine (ALLERGY RELIEF) 10 MG tablet Take 10 mg by mouth daily as needed for allergies.   meclizine (ANTIVERT) 25 MG tablet Take 25 mg by mouth 3 (three) times daily as needed for dizziness.   metoprolol succinate (TOPROL-XL) 25 MG 24 hr tablet Take 1 tablet (25 mg total) by mouth daily.   nitroGLYCERIN (NITROSTAT) 0.4 MG SL tablet Place 1 tablet (0.4 mg total) under the tongue every 5 (five) minutes as needed for chest pain.   pantoprazole (PROTONIX) 40 MG tablet Take 1 tablet (40 mg total) by mouth daily.   rosuvastatin (CRESTOR) 20 MG tablet Take 1 tablet (20 mg total) by mouth daily.   [DISCONTINUED] aspirin EC 81 MG tablet Take 81 mg by mouth daily. Swallow whole.   [DISCONTINUED] hydrochlorothiazide (HYDRODIURIL) 12.5 MG tablet Take 12.5 mg by mouth every other day.     Allergies:   Avelox [moxifloxacin hcl in nacl], Ceclor [cefaclor], Codeine, Penicillins, Lunesta [eszopiclone], and Zocor [simvastatin]   Social History   Socioeconomic History   Marital status: Married    Spouse name: Not on file   Number of children: Not on file   Years of education: Not on file   Highest education level: Not on file  Occupational History   Not on file  Tobacco Use   Smoking status: Every Day    Packs/day: 1.00    Years: 8.00    Additional pack years: 0.00    Total pack years: 8.00    Types: Cigarettes   Smokeless tobacco: Never  Substance and Sexual Activity   Alcohol use: No   Drug use: No   Sexual activity: Not on file  Other Topics Concern   Not on file  Social History Narrative   Not on file   Social Determinants of Health   Financial Resource Strain: Not on file  Food  Insecurity: Not on file  Transportation Needs: Not on file  Physical Activity: Not on file  Stress: Not on file  Social Connections: Not on file     Family History: The patient's family history includes Alzheimer's disease in her father and mother; Bone cancer in her brother; Breast cancer in her maternal aunt; Congestive Heart Failure in her brother and father; Heart attack in her mother; Hypertension in her brother and mother; Prostate cancer in her brother; Stroke in her mother; Thyroid disease in her father and mother. ROS:   Please see the history of present illness.    All 14 point review of systems negative except as described per history of present illness  EKGs/Labs/Other Studies Reviewed:      Recent Labs: 10/17/2022: BUN 16; Creatinine, Ser 0.86; Hemoglobin 15.6; Platelets 341; Potassium 4.7; Sodium 141  Recent Lipid Panel No results found for: "CHOL", "TRIG", "HDL", "CHOLHDL", "VLDL", "LDLCALC", "LDLDIRECT"  Physical Exam:    VS:  BP 138/74 (BP Location: Left Arm, Patient Position: Sitting)   Pulse 70   Ht 5' 1.25" (1.556 m)   Wt 220 lb 3.2 oz (99.9 kg)   SpO2 96%   BMI 41.27 kg/m     Wt Readings from Last 3 Encounters:  04/03/23 220 lb 3.2 oz (99.9 kg)  10/22/22 194 lb (88 kg)  10/17/22 194 lb 6.4 oz (88.2 kg)     GEN:  Well nourished, well developed in no acute distress HEENT: Normal NECK: No JVD; No carotid bruits LYMPHATICS: No lymphadenopathy CARDIAC: RRR, no murmurs, no rubs, no gallops RESPIRATORY:  Clear to auscultation without rales, wheezing or rhonchi  ABDOMEN: Soft, non-tender, non-distended MUSCULOSKELETAL:  No edema; No deformity  SKIN: Warm and dry LOWER EXTREMITIES: no swelling NEUROLOGIC:  Alert and oriented x 3 PSYCHIATRIC:  Normal affect   ASSESSMENT:    1. Angina pectoris (HCC)   2. Coronary artery disease involving native coronary artery of native heart without angina pectoris   3. Primary hypertension   4. Dyslipidemia     PLAN:    In order of problems listed above:  Angina pectoris denies having any. Coronary disease.  I am disappointed with the father do elevated platelets therapy has been interrupted she does not want to go back on it and this is already almost 6 months.  Therefore, we will continue with Plavix.  Will continue rest of the medication. Dyslipidemia did review her K PN which show me data from Apr 13, 2022 with LDL 85 HDL 64.  Will continue high intensity statin with Zetia fasting lipid profile will be redone. Diffuse advanced atherosclerosis I scheduled her to have arterial duplex evaluation of carotic arteries to make sure she does not have any significant stenosis there   Medication Adjustments/Labs and Tests Ordered: Current medicines are reviewed at length with the patient today.  Concerns regarding medicines are outlined above.  No orders of the defined types were placed in this encounter.  Medication changes: No orders of the defined types were placed in this encounter.   Signed, Georgeanna Lea, MD, [facility] 04/03/2023 1:38 PM    [facility]

## 2023-04-04 DIAGNOSIS — E785 Hyperlipidemia, unspecified: Secondary | ICD-10-CM | POA: Diagnosis not present

## 2023-04-05 LAB — LIPID PANEL
Chol/HDL Ratio: 2.1 ratio (ref 0.0–4.4)
Cholesterol, Total: 141 mg/dL (ref 100–199)
HDL: 67 mg/dL (ref >39)
LDL Chol Calc (NIH): 51 mg/dL (ref 0–99)
Triglycerides: 134 mg/dL (ref 0–149)
VLDL Cholesterol Cal: 23 mg/dL (ref 5–40)

## 2023-04-11 ENCOUNTER — Telehealth: Payer: Self-pay

## 2023-04-11 NOTE — Telephone Encounter (Signed)
-----   Message from Georgeanna Lea, MD sent at 04/10/2023  9:54 AM EDT ----- Lipid panel good, continue present management

## 2023-04-11 NOTE — Telephone Encounter (Signed)
Patient notified through my chart.

## 2023-04-22 ENCOUNTER — Ambulatory Visit: Payer: Medicare HMO | Attending: Cardiology

## 2023-04-22 DIAGNOSIS — R0989 Other specified symptoms and signs involving the circulatory and respiratory systems: Secondary | ICD-10-CM | POA: Diagnosis not present

## 2023-04-26 ENCOUNTER — Telehealth: Payer: Self-pay

## 2023-04-26 NOTE — Telephone Encounter (Signed)
Patient notified through my chart.

## 2023-04-26 NOTE — Telephone Encounter (Signed)
-----   Message from Georgeanna Lea, MD sent at 04/24/2023  9:11 AM EDT ----- Carotic ultrasound show mild disease up to 39% stenosis noted bilaterally, no need to intervene

## 2023-05-09 DIAGNOSIS — G720 Drug-induced myopathy: Secondary | ICD-10-CM | POA: Diagnosis not present

## 2023-05-09 DIAGNOSIS — I10 Essential (primary) hypertension: Secondary | ICD-10-CM | POA: Diagnosis not present

## 2023-05-09 DIAGNOSIS — D692 Other nonthrombocytopenic purpura: Secondary | ICD-10-CM | POA: Diagnosis not present

## 2023-05-09 DIAGNOSIS — I25119 Atherosclerotic heart disease of native coronary artery with unspecified angina pectoris: Secondary | ICD-10-CM | POA: Diagnosis not present

## 2023-05-09 DIAGNOSIS — Y758 Miscellaneous neurological devices associated with adverse incidents, not elsewhere classified: Secondary | ICD-10-CM | POA: Diagnosis not present

## 2023-05-09 DIAGNOSIS — I6529 Occlusion and stenosis of unspecified carotid artery: Secondary | ICD-10-CM | POA: Diagnosis not present

## 2023-05-09 DIAGNOSIS — M1991 Primary osteoarthritis, unspecified site: Secondary | ICD-10-CM | POA: Diagnosis not present

## 2023-05-09 DIAGNOSIS — F331 Major depressive disorder, recurrent, moderate: Secondary | ICD-10-CM | POA: Diagnosis not present

## 2023-05-09 DIAGNOSIS — E785 Hyperlipidemia, unspecified: Secondary | ICD-10-CM | POA: Diagnosis not present

## 2023-05-09 DIAGNOSIS — F411 Generalized anxiety disorder: Secondary | ICD-10-CM | POA: Diagnosis not present

## 2023-05-09 DIAGNOSIS — I5189 Other ill-defined heart diseases: Secondary | ICD-10-CM | POA: Diagnosis not present

## 2023-05-09 DIAGNOSIS — T466X5A Adverse effect of antihyperlipidemic and antiarteriosclerotic drugs, initial encounter: Secondary | ICD-10-CM | POA: Diagnosis not present

## 2023-07-25 ENCOUNTER — Other Ambulatory Visit: Payer: Self-pay | Admitting: Cardiology

## 2023-07-26 ENCOUNTER — Telehealth: Payer: Self-pay | Admitting: Cardiology

## 2023-07-26 NOTE — Telephone Encounter (Signed)
Pt is requesting a callback regarding her medications. Please advise

## 2023-07-26 NOTE — Telephone Encounter (Signed)
Spoke with pt about refills on Pantoprazole and Imdur. Searched chart and found that Imdur had been stopped on 10-22-22 when she had a cardiac cath. Pt is still taking the medication. Will send to Dr. Bing Matter to see if pt should continue Imdur or discontinue.

## 2023-07-29 NOTE — Telephone Encounter (Signed)
Patient is following up. She would like to know what Dr. Bing Matter recommends.

## 2023-08-27 ENCOUNTER — Other Ambulatory Visit (HOSPITAL_COMMUNITY): Payer: Self-pay

## 2023-08-27 ENCOUNTER — Other Ambulatory Visit: Payer: Self-pay

## 2023-09-24 DIAGNOSIS — H524 Presbyopia: Secondary | ICD-10-CM | POA: Diagnosis not present

## 2023-10-16 ENCOUNTER — Ambulatory Visit: Payer: Medicare HMO | Attending: Cardiology | Admitting: Cardiology

## 2023-10-16 ENCOUNTER — Encounter: Payer: Self-pay | Admitting: Cardiology

## 2023-10-16 VITALS — BP 120/76 | HR 56 | Ht 61.0 in | Wt 219.2 lb

## 2023-10-16 DIAGNOSIS — I209 Angina pectoris, unspecified: Secondary | ICD-10-CM

## 2023-10-16 DIAGNOSIS — I10 Essential (primary) hypertension: Secondary | ICD-10-CM | POA: Diagnosis not present

## 2023-10-16 DIAGNOSIS — E785 Hyperlipidemia, unspecified: Secondary | ICD-10-CM | POA: Diagnosis not present

## 2023-10-16 DIAGNOSIS — I2510 Atherosclerotic heart disease of native coronary artery without angina pectoris: Secondary | ICD-10-CM | POA: Diagnosis not present

## 2023-10-16 NOTE — Progress Notes (Signed)
Cardiology Office Note:    Date:  10/16/2023   ID:  [name], DOB [birth year], MRN [id number]  PCP:  Street, Stephanie Coup, MD  Cardiologist:  Gypsy Balsam, MD    Referring MD: Street, Stephanie Coup, *   Chief Complaint  Patient presents with   Medication Refill    nITROGLYCERIN     History of Present Illness:    [name] is a 67 y.o. female past medical history significant for coronary artery disease.  Year ago she had cardiac catheterization and required PTCA and stenting to proximal mid LAD with atherectomy, she does have residual 40% obtuse marginal branch and 40% RCA.  She does have COPD, anxiety, depression, dyslipidemia. Comes to my office, asymptomatic.  Denies have any chest pain tightness squeezing pressure burning chest no palpitations dizziness swelling of lower extremities overall doing well.  She does have a lot of marital trouble and she talks a lot during the visit about that.  She is planning to the divorce with her husband.  They been married for 38 years  Past Medical History:  Diagnosis Date   Anxiety    Claustrophobia    COPD (chronic obstructive pulmonary disease) (HCC)    Deafness    right ear   Depression    Facet arthritis of lumbar region    GERD (gastroesophageal reflux disease)    Hypertension, essential, benign    Meniere disease    PONV (postoperative nausea and vomiting)    Primary localized osteoarthritis of left knee    Severe episode of recurrent major depressive disorder, without psychotic features (HCC)    Sleep apnea    moderate; does not wear CPAP    Urinary incontinence    has increased urination at bedtime; pt wears pads at bedtime, but still wets the bed    Past Surgical History:  Procedure Laterality Date   ABDOMINAL HYSTERECTOMY     BLADDER SUSPENSION     CARPAL TUNNEL RELEASE Left 2003   Performed by Dr. Doristine Section   CESAREAN SECTION     CORONARY ATHERECTOMY N/A 10/22/2022   Procedure: CORONARY  ATHERECTOMY;  Surgeon: Kathleene Hazel, MD;  [facility]: [facility];  Service: Cardiovascular;  Laterality: N/A;   CORONARY LITHOTRIPSY N/A 10/22/2022   Procedure: CORONARY LITHOTRIPSY;  Surgeon: Kathleene Hazel, MD;  [facility];  Service: Cardiovascular;  Laterality: N/A;   CORONARY STENT INTERVENTION N/A 10/22/2022   Procedure: CORONARY STENT INTERVENTION;  Surgeon: Kathleene Hazel, MD;  [facility];  Service: Cardiovascular;  Laterality: N/A;   EYE SURGERY     melanoma removed from right eye   KNEE ARTHROSCOPY Left 12/03/2004   LEFT HEART CATH AND CORONARY ANGIOGRAPHY N/A 10/22/2022   Procedure: LEFT HEART CATH AND CORONARY ANGIOGRAPHY;  Surgeon: Kathleene Hazel, MD;  [facility]: [facility];  Service: Cardiovascular;  Laterality: N/A;   TOTAL KNEE ARTHROPLASTY Left 02/07/2015   dr Thurston Hole   TOTAL KNEE ARTHROPLASTY Left 02/07/2015   Procedure: LEFT TOTAL KNEE ARTHROPLASTY;  Surgeon: Salvatore Marvel, MD;  Location: [facility] OR;  Service: Orthopedics;  Laterality: Left;    Current Medications: Current Meds  Medication Sig   albuterol (VENTOLIN HFA) 108 (90 Base) MCG/ACT inhaler Inhale 2 puffs into the lungs every 6 (six) hours as needed for wheezing or shortness of breath.   ALPRAZolam (XANAX) 0.5 MG tablet Take 0.5 mg by mouth 3 (three) times daily as needed for anxiety.  amLODipine (NORVASC) 10 MG tablet Take 10 mg by mouth daily.   aspirin EC 81 MG tablet Take 81 mg by mouth daily. Swallow whole.   ezetimibe (ZETIA) 10 MG tablet Take 10 mg by mouth daily.   hydrochlorothiazide (HYDRODIURIL) 12.5 MG tablet Take 12.5 mg by mouth daily.   lisinopril (ZESTRIL) 40 MG tablet Take 40 mg by mouth at bedtime.   loratadine (ALLERGY RELIEF) 10 MG tablet Take 10 mg by mouth daily as needed for allergies.   meclizine (ANTIVERT) 25 MG tablet Take 25 mg by mouth 3 (three) times daily as needed for dizziness.   nitroGLYCERIN  (NITROSTAT) 0.4 MG SL tablet Place 1 tablet (0.4 mg total) under the tongue every 5 (five) minutes as needed for chest pain.   pantoprazole (PROTONIX) 40 MG tablet Take 1 tablet (40 mg total) by mouth daily.   rosuvastatin (CRESTOR) 20 MG tablet Take 1 tablet (20 mg total) by mouth daily.   [DISCONTINUED] acetaminophen (TYLENOL) 325 MG tablet Take 2 tablets (650 mg total) by mouth every 6 (six) hours as needed for mild pain (or Fever >/= 101).   [DISCONTINUED] clopidogrel (PLAVIX) 75 MG tablet Take 1 tablet (75 mg total) by mouth daily with breakfast.   [DISCONTINUED] metoprolol succinate (TOPROL-XL) 25 MG 24 hr tablet Take 1 tablet (25 mg total) by mouth daily.     Allergies:   Avelox [moxifloxacin hcl in nacl], Ceclor [cefaclor], Codeine, Penicillins, Lunesta [eszopiclone], and Zocor [simvastatin]   Social History   Socioeconomic History   Marital status: Married    Spouse name: Not on file   Number of children: Not on file   Years of education: Not on file   Highest education level: Not on file  Occupational History   Not on file  Tobacco Use   Smoking status: Every Day    Current packs/day: 1.00    Average packs/day: 1 pack/day for 8.0 years (8.0 ttl pk-yrs)    Types: Cigarettes   Smokeless tobacco: Never  Substance and Sexual Activity   Alcohol use: No   Drug use: No   Sexual activity: Not on file  Other Topics Concern   Not on file  Social History Narrative   Not on file   Social Determinants of Health   Financial Resource Strain: Not on file  Food Insecurity: Not on file  Transportation Needs: Not on file  Physical Activity: Not on file  Stress: Not on file  Social Connections: Unknown (04/17/2022)   Received from [facility], [facility]   Social Network    Social Network: Not on file     Family History: The patient's family history includes Alzheimer's disease in her father and mother; Bone cancer in her brother; Breast cancer in her maternal aunt;  Congestive Heart Failure in her brother and father; Heart attack in her mother; Hypertension in her brother and mother; Prostate cancer in her brother; Stroke in her mother; Thyroid disease in her father and mother. ROS:   Please see the history of present illness.    All 14 point review of systems negative except as described per history of present illness  EKGs/Labs/Other Studies Reviewed:         Recent Labs: 10/17/2022: BUN 16; Creatinine, Ser 0.86; Hemoglobin 15.6; Platelets 341; Potassium 4.7; Sodium 141  Recent Lipid Panel    Component Value Date/Time   CHOL 141 04/04/2023 1324   TRIG 134 04/04/2023 1324   HDL 67 04/04/2023 1324   CHOLHDL 2.1 04/04/2023 1324  LDLCALC 51 04/04/2023 1324    Physical Exam:    VS:  BP 120/76 (BP Location: Left Arm, Patient Position: Sitting)   Pulse (!) 56   Ht 5\' 1"  (1.549 m)   Wt 219 lb 3.2 oz (99.4 kg)   SpO2 95%   BMI 41.42 kg/m     Wt Readings from Last 3 Encounters:  10/16/23 219 lb 3.2 oz (99.4 kg)  04/03/23 220 lb 3.2 oz (99.9 kg)  10/22/22 194 lb (88 kg)     GEN:  Well nourished, well developed in no acute distress HEENT: Normal NECK: No JVD; No carotid bruits LYMPHATICS: No lymphadenopathy CARDIAC: RRR, no murmurs, no rubs, no gallops RESPIRATORY:  Clear to auscultation without rales, wheezing or rhonchi  ABDOMEN: Soft, non-tender, non-distended MUSCULOSKELETAL:  No edema; No deformity  SKIN: Warm and dry LOWER EXTREMITIES: no swelling NEUROLOGIC:  Alert and oriented x 3 PSYCHIATRIC:  Normal affect   ASSESSMENT:    1. Primary hypertension   2. Angina pectoris (HCC)   3. Coronary artery disease involving native coronary artery of native heart without angina pectoris   4. Dyslipidemia    PLAN:    In order of problems listed above:  Coronary disease: Asymptomatic stable doing well we will continue present management.  She will stop dual antiplatelet therapy on the year anniversary of her PTCA and  stenting. Essential hypertension blood pressure well-controlled continue present management. Dyslipidemia I did review K PN show me LDL 51 HDL 67.  She is taking high intense statin which I will continue with Crestor 20. Overall she is doing fine I encouraged her to be a little more active.   Medication Adjustments/Labs and Tests Ordered: Current medicines are reviewed at length with the patient today.  Concerns regarding medicines are outlined above.  Orders Placed This Encounter  Procedures   EKG 12-Lead   Medication changes: No orders of the defined types were placed in this encounter.   Signed, Georgeanna Lea, MD, [facility] 10/16/2023 9:58 AM    [facility]

## 2023-10-16 NOTE — Patient Instructions (Signed)
Medication Instructions:  Your physician has recommended you make the following change in your medication:   Stop Plavix as directed.  *If you need a refill on your cardiac medications before your next appointment, please call your pharmacy*   Lab Work: None ordered If you have labs (blood work) drawn today and your tests are completely normal, you will receive your results only by: MyChart Message (if you have MyChart) OR A paper copy in the mail If you have any lab test that is abnormal or we need to change your treatment, we will call you to review the results.   Testing/Procedures: None ordered   Follow-Up: At [facility], you and your health needs are our priority.  As part of our continuing mission to provide you with exceptional heart care, we have created designated Provider Care Teams.  These Care Teams include your primary Cardiologist (physician) and Advanced Practice Providers (APPs -  Physician Assistants and Nurse Practitioners) who all work together to provide you with the care you need, when you need it.  We recommend signing up for the patient portal called "MyChart".  Sign up information is provided on this After Visit Summary.  MyChart is used to connect with patients for Virtual Visits (Telemedicine).  Patients are able to view lab/test results, encounter notes, upcoming appointments, etc.  Non-urgent messages can be sent to your provider as well.   To learn more about what you can do with MyChart, go to [email or web address].    Your next appointment:   6 month(s)  The format for your next appointment:   In Person  Provider:   Gypsy Balsam, MD    Other Instructions none  Important Information About Sugar

## 2023-10-31 ENCOUNTER — Other Ambulatory Visit: Payer: Self-pay | Admitting: Cardiology

## 2024-04-14 DIAGNOSIS — L820 Inflamed seborrheic keratosis: Secondary | ICD-10-CM | POA: Diagnosis not present

## 2024-04-14 DIAGNOSIS — L700 Acne vulgaris: Secondary | ICD-10-CM | POA: Diagnosis not present

## 2024-04-14 DIAGNOSIS — L578 Other skin changes due to chronic exposure to nonionizing radiation: Secondary | ICD-10-CM | POA: Diagnosis not present

## 2024-04-14 DIAGNOSIS — L728 Other follicular cysts of the skin and subcutaneous tissue: Secondary | ICD-10-CM | POA: Diagnosis not present

## 2024-04-15 ENCOUNTER — Ambulatory Visit: Payer: Medicare HMO | Attending: Cardiology | Admitting: Cardiology

## 2024-04-15 ENCOUNTER — Encounter: Payer: Self-pay | Admitting: Cardiology

## 2024-04-15 VITALS — BP 120/76 | HR 60 | Ht 61.25 in | Wt 214.2 lb

## 2024-04-15 DIAGNOSIS — K219 Gastro-esophageal reflux disease without esophagitis: Secondary | ICD-10-CM

## 2024-04-15 DIAGNOSIS — G4733 Obstructive sleep apnea (adult) (pediatric): Secondary | ICD-10-CM | POA: Diagnosis not present

## 2024-04-15 DIAGNOSIS — I2510 Atherosclerotic heart disease of native coronary artery without angina pectoris: Secondary | ICD-10-CM | POA: Diagnosis not present

## 2024-04-15 DIAGNOSIS — E785 Hyperlipidemia, unspecified: Secondary | ICD-10-CM | POA: Diagnosis not present

## 2024-04-15 NOTE — Progress Notes (Signed)
 Cardiology Office Note:    Date:  04/15/2024   ID:  [name], DOB 1956/06/23, MRN [id number]  PCP:  Street, [name], MD  Cardiologist:  Ralene Burger, MD    Referring MD: Street, [name], *   Chief Complaint  Patient presents with   Follow-up    History of Present Illness:    [name] is a 68 y.o. female past medical history significant for coronary artery disease coronary CT angio done shows severe stenosis of mid LAD with heavy calcification of the LAD also 50 to 69% stenosis of diagonal branch as well as large obtuse marginal branch and heavy calcification 50 to 69% stenosis of the right coronary artery.  She did have cardiac catheterization done which confirmed severe stenosis of LAD but only 40% obtuse marginal and 40% RCA.  LAD has being addressed with drug-eluting stent.  That was in October 22, 2022.  She comes today to months for follow-up since that time she is doing great.  She is asymptomatic denies have any chest pain tightness squeezing pressure burning chest.  Additional problem include essential hypertension dyslipidemia COPD  Past Medical History:  Diagnosis Date   Anxiety    Claustrophobia    COPD (chronic obstructive pulmonary disease) (HCC)    Deafness    right ear   Depression    Facet arthritis of lumbar region    GERD (gastroesophageal reflux disease)    Hypertension, essential, benign    Meniere disease    PONV (postoperative nausea and vomiting)    Primary localized osteoarthritis of left knee    Severe episode of recurrent major depressive disorder, without psychotic features (HCC)    Sleep apnea    moderate; does not wear CPAP    Urinary incontinence    has increased urination at bedtime; pt wears pads at bedtime, but still wets the bed    Past Surgical History:  Procedure Laterality Date   ABDOMINAL HYSTERECTOMY     BLADDER SUSPENSION     CARPAL TUNNEL RELEASE Left 2003   Performed by Dr. Brandt Cake   CESAREAN  SECTION     CORONARY ATHERECTOMY N/A 10/22/2022   Procedure: CORONARY ATHERECTOMY;  Surgeon: Odie Benne, MD;  [facility];  Service: Cardiovascular;  Laterality: N/A;   CORONARY LITHOTRIPSY N/A 10/22/2022   Procedure: CORONARY LITHOTRIPSY;  Surgeon: Odie Benne, MD;  Location: [facility];  Service: Cardiovascular;  Laterality: N/A;   CORONARY STENT INTERVENTION N/A 10/22/2022   Procedure: CORONARY STENT INTERVENTION;  Surgeon: Odie Benne, MD;  [facility]: [facility];  Service: Cardiovascular;  Laterality: N/A;   EYE SURGERY     melanoma removed from right eye   KNEE ARTHROSCOPY Left 12/03/2004   LEFT HEART CATH AND CORONARY ANGIOGRAPHY N/A 10/22/2022   Procedure: LEFT HEART CATH AND CORONARY ANGIOGRAPHY;  Surgeon: Odie Benne, MD;  [facility]: [facility];  Service: Cardiovascular;  Laterality: N/A;   TOTAL KNEE ARTHROPLASTY Left 02/07/2015   dr Jinger Mount   TOTAL KNEE ARTHROPLASTY Left 02/07/2015   Procedure: LEFT TOTAL KNEE ARTHROPLASTY;  Surgeon: Elly Habermann, MD;  Location: [facility] OR;  Service: Orthopedics;  Laterality: Left;    Current Medications: Current Meds  Medication Sig   albuterol  (VENTOLIN  HFA) 108 (90 Base) MCG/ACT inhaler Inhale 2 puffs into the lungs every 6 (six) hours as needed for wheezing or shortness of breath.   ALPRAZolam  (XANAX ) 0.5 MG tablet Take 0.5 mg by  mouth 3 (three) times daily as needed for anxiety.   amLODipine  (NORVASC ) 10 MG tablet Take 10 mg by mouth daily.   aspirin  EC 81 MG tablet Take 81 mg by mouth daily. Swallow whole.   clopidogrel  (PLAVIX ) 75 MG tablet Take 75 mg by mouth daily.   ezetimibe  (ZETIA ) 10 MG tablet Take 10 mg by mouth daily.   hydrochlorothiazide  (HYDRODIURIL ) 12.5 MG tablet Take 12.5 mg by mouth daily.   lisinopril (ZESTRIL) 40 MG tablet Take 40 mg by mouth at bedtime.   loratadine (ALLERGY RELIEF) 10 MG tablet Take 10 mg by mouth daily as needed for  allergies.   meclizine (ANTIVERT) 25 MG tablet Take 25 mg by mouth 3 (three) times daily as needed for dizziness.   metoprolol  succinate (TOPROL -XL) 25 MG 24 hr tablet Take 25 mg by mouth daily.   nitroGLYCERIN  (NITROSTAT ) 0.4 MG SL tablet Place 1 tablet (0.4 mg total) under the tongue every 5 (five) minutes as needed for chest pain.   pantoprazole  (PROTONIX ) 40 MG tablet Take 1 tablet (40 mg total) by mouth daily.   rosuvastatin  (CRESTOR ) 20 MG tablet Take 1 tablet (20 mg total) by mouth daily.     Allergies:   Avelox [moxifloxacin hcl in nacl], Ceclor [cefaclor], Codeine, Penicillins, Lunesta [eszopiclone], and Zocor [simvastatin]   Social History   Socioeconomic History   Marital status: Married    Spouse name: Not on file   Number of children: Not on file   Years of education: Not on file   Highest education level: Not on file  Occupational History   Not on file  Tobacco Use   Smoking status: Every Day    Current packs/day: 1.00    Average packs/day: 1 pack/day for 8.0 years (8.0 ttl pk-yrs)    Types: Cigarettes   Smokeless tobacco: Never  Substance and Sexual Activity   Alcohol use: No   Drug use: No   Sexual activity: Not on file  Other Topics Concern   Not on file  Social History Narrative   Not on file   Social Drivers of Health   Financial Resource Strain: Not on file  Food Insecurity: Not on file  Transportation Needs: Not on file  Physical Activity: Not on file  Stress: Not on file  Social Connections: Unknown (04/17/2022)   Received from [facility]   Social Network    Social Network: Not on file     Family History: The patient's family history includes Alzheimer's disease in her father and mother; Bone cancer in her brother; Breast cancer in her maternal aunt; Congestive Heart Failure in her brother and father; Heart attack in her mother; Hypertension in her brother and mother; Prostate cancer in her brother; Stroke in her mother; Thyroid   disease in her father and mother. ROS:   Please see the history of present illness.    All 14 point review of systems negative except as described per history of present illness  EKGs/Labs/Other Studies Reviewed:         Recent Labs: No results found for requested labs within last 365 days.  Recent Lipid Panel    Component Value Date/Time   CHOL 141 04/04/2023 1324   TRIG 134 04/04/2023 1324   HDL 67 04/04/2023 1324   CHOLHDL 2.1 04/04/2023 1324   LDLCALC 51 04/04/2023 1324    Physical Exam:    VS:  BP 120/76 (BP Location: Right Arm, Patient Position: Sitting)   Pulse 60   Ht  5' 1.25" (1.556 m)   Wt 214 lb 3.2 oz (97.2 kg)   SpO2 95%   BMI 40.14 kg/m     Wt Readings from Last 3 Encounters:  04/15/24 214 lb 3.2 oz (97.2 kg)  10/16/23 219 lb 3.2 oz (99.4 kg)  04/03/23 220 lb 3.2 oz (99.9 kg)     GEN:  Well nourished, well developed in no acute distress HEENT: Normal NECK: No JVD; No carotid bruits LYMPHATICS: No lymphadenopathy CARDIAC: RRR, no murmurs, no rubs, no gallops RESPIRATORY:  Clear to auscultation without rales, wheezing or rhonchi  ABDOMEN: Soft, non-tender, non-distended MUSCULOSKELETAL:  No edema; No deformity  SKIN: Warm and dry LOWER EXTREMITIES: no swelling NEUROLOGIC:  Alert and oriented x 3 PSYCHIATRIC:  Normal affect   ASSESSMENT:    1. Coronary artery disease involving native coronary artery of native heart without angina pectoris   2. Obstructive sleep apnea syndrome   3. Gastroesophageal reflux disease, unspecified whether esophagitis present   4. Dyslipidemia    PLAN:    In order of problems listed above:  Coronary disease stable from) severe asymptomatic on guideline directed medical therapy which I will continue. Obstructive sleep apnea.  Stable. Gastroesophageal reflux disease stable. Dyslipidemia I did review K PN which show me total cholesterol 147 HDL 53 LDL 51 we will continue present management.  She is already on  monotherapy for antiplatelets with aspirin    Medication Adjustments/Labs and Tests Ordered: Current medicines are reviewed at length with the patient today.  Concerns regarding medicines are outlined above.  No orders of the defined types were placed in this encounter.  Medication changes: No orders of the defined types were placed in this encounter.   Signed, Manfred Seed, MD, [facility] 04/15/2024 10:38 AM    [facility]

## 2024-04-15 NOTE — Patient Instructions (Signed)

## 2024-07-06 DIAGNOSIS — L820 Inflamed seborrheic keratosis: Secondary | ICD-10-CM | POA: Diagnosis not present

## 2024-07-06 DIAGNOSIS — L578 Other skin changes due to chronic exposure to nonionizing radiation: Secondary | ICD-10-CM | POA: Diagnosis not present

## 2024-07-06 DIAGNOSIS — L570 Actinic keratosis: Secondary | ICD-10-CM | POA: Diagnosis not present

## 2024-07-06 DIAGNOSIS — D485 Neoplasm of uncertain behavior of skin: Secondary | ICD-10-CM | POA: Diagnosis not present

## 2024-07-06 DIAGNOSIS — L814 Other melanin hyperpigmentation: Secondary | ICD-10-CM | POA: Diagnosis not present

## 2024-07-06 DIAGNOSIS — D225 Melanocytic nevi of trunk: Secondary | ICD-10-CM | POA: Diagnosis not present

## 2024-10-14 DIAGNOSIS — E8881 Metabolic syndrome: Secondary | ICD-10-CM | POA: Diagnosis not present

## 2024-10-14 DIAGNOSIS — Z6837 Body mass index (BMI) 37.0-37.9, adult: Secondary | ICD-10-CM | POA: Diagnosis not present

## 2024-10-22 ENCOUNTER — Other Ambulatory Visit: Payer: Self-pay | Admitting: Cardiology

## 2024-11-30 DIAGNOSIS — M47816 Spondylosis without myelopathy or radiculopathy, lumbar region: Secondary | ICD-10-CM | POA: Insufficient documentation

## 2024-11-30 DIAGNOSIS — H8109 Meniere's disease, unspecified ear: Secondary | ICD-10-CM | POA: Insufficient documentation

## 2024-11-30 DIAGNOSIS — I10 Essential (primary) hypertension: Secondary | ICD-10-CM | POA: Insufficient documentation

## 2024-11-30 DIAGNOSIS — J449 Chronic obstructive pulmonary disease, unspecified: Secondary | ICD-10-CM | POA: Insufficient documentation

## 2024-11-30 DIAGNOSIS — F332 Major depressive disorder, recurrent severe without psychotic features: Secondary | ICD-10-CM | POA: Insufficient documentation

## 2024-12-04 ENCOUNTER — Encounter: Payer: Self-pay | Admitting: Cardiology

## 2024-12-04 ENCOUNTER — Ambulatory Visit: Attending: Cardiology | Admitting: Cardiology

## 2024-12-04 VITALS — BP 142/78 | HR 63 | Ht 61.0 in | Wt 197.0 lb

## 2024-12-04 DIAGNOSIS — E785 Hyperlipidemia, unspecified: Secondary | ICD-10-CM

## 2024-12-04 DIAGNOSIS — I10 Essential (primary) hypertension: Secondary | ICD-10-CM | POA: Diagnosis not present

## 2024-12-04 DIAGNOSIS — Z87898 Personal history of other specified conditions: Secondary | ICD-10-CM | POA: Diagnosis not present

## 2024-12-04 DIAGNOSIS — I2510 Atherosclerotic heart disease of native coronary artery without angina pectoris: Secondary | ICD-10-CM | POA: Diagnosis not present

## 2024-12-04 NOTE — Progress Notes (Signed)
 " Cardiology Office Note:    Date:  12/04/2024   ID:  KENNETH LAX, DOB Jun 26, 1956, MRN [id number]  PCP:  Street, Lonni HERO, MD  Cardiologist:  Lamar Fitch, MD    Referring MD: Street, Lonni HERO, MD   No chief complaint on file. Doing great  History of Present Illness:    [name] is a 69 y.o. female comes today to months for follow-up, past medical history significant for coronary artery disease coronary CT angio done shows severe stenosis of mid LAD with heavy calcification of the LAD also 50 to 69% stenosis of diagonal branch as well as large obtuse marginal branch and heavy calcification 50 to 69% stenosis of the right coronary artery. She did have cardiac catheterization done which confirmed severe stenosis of LAD but only 40% obtuse marginal and 40% RCA. LAD has being addressed with drug-eluting stent. That was in October 22, 2022. overall she is doing well.  She denies having chest pain tightness squeezing pressure mid chest no palpitation dizziness swelling of lower extremity she was on Ozempic and lost significant mount of weight very happy but apparently insurance company do not want to pay for this medication anymore  Past Medical History:  Diagnosis Date   Angina pectoris 10/17/2022   Anxiety    Atypical chest pain 09/24/2022   Claustrophobia    COPD (chronic obstructive pulmonary disease) (HCC)    Coronary artery disease abnormal coronary CT angio atherectomy PTCA and stenting to proximal mid LAD in October 2023 10/17/2022   Deafness    right ear   Depression    DJD (degenerative joint disease) of knee 02/07/2015   Dyslipidemia 09/24/2022   Facet arthritis of lumbar region    GERD (gastroesophageal reflux disease)    Hypertension    Hypertension, essential, benign    Meniere disease    PONV (postoperative nausea and vomiting)    Primary localized osteoarthritis of left knee    Severe episode of recurrent major depressive disorder, without  psychotic features (HCC)    Sleep apnea    moderate; does not wear CPAP    Urinary incontinence    has increased urination at bedtime; pt wears pads at bedtime, but still wets the bed    Past Surgical History:  Procedure Laterality Date   ABDOMINAL HYSTERECTOMY     BLADDER SUSPENSION     CARPAL TUNNEL RELEASE Left 2003   Performed by Dr. Jerrell Mt   CESAREAN SECTION     CORONARY ATHERECTOMY N/A 10/22/2022   Procedure: CORONARY ATHERECTOMY;  Surgeon: Verlin Lonni BIRCH, MD;  [facility]: [facility];  Service: Cardiovascular;  Laterality: N/A;   CORONARY LITHOTRIPSY N/A 10/22/2022   Procedure: CORONARY LITHOTRIPSY;  Surgeon: Verlin Lonni BIRCH, MD;  [facility];  Service: Cardiovascular;  Laterality: N/A;   CORONARY STENT INTERVENTION N/A 10/22/2022   Procedure: CORONARY STENT INTERVENTION;  Surgeon: Verlin Lonni BIRCH, MD;  [facility];  Service: Cardiovascular;  Laterality: N/A;   EYE SURGERY     melanoma removed from right eye   KNEE ARTHROSCOPY Left 12/03/2004   LEFT HEART CATH AND CORONARY ANGIOGRAPHY N/A 10/22/2022   Procedure: LEFT HEART CATH AND CORONARY ANGIOGRAPHY;  Surgeon: Verlin Lonni BIRCH, MD;  [facility]: [facility];  Service: Cardiovascular;  Laterality: N/A;   TOTAL KNEE ARTHROPLASTY Left 02/07/2015   dr jane   TOTAL KNEE ARTHROPLASTY Left 02/07/2015   Procedure: LEFT TOTAL KNEE ARTHROPLASTY;  Surgeon: Lamar Jane,  MD;  Location: [facility];  Service: Orthopedics;  Laterality: Left;    Current Medications: Active Medications[1]   Allergies:   Avelox [moxifloxacin hcl in nacl], Ceclor [cefaclor], Codeine, Penicillins, Lunesta [eszopiclone], and Zocor [simvastatin]   Social History   Socioeconomic History   Marital status: Married    Spouse name: Not on file   Number of children: Not on file   Years of education: Not on file   Highest education level: Not on file  Occupational History   Not  on file  Tobacco Use   Smoking status: Every Day    Current packs/day: 1.00    Average packs/day: 1 pack/day for 8.0 years (8.0 ttl pk-yrs)    Types: Cigarettes   Smokeless tobacco: Never  Substance and Sexual Activity   Alcohol use: No   Drug use: No   Sexual activity: Not on file  Other Topics Concern   Not on file  Social History Narrative   Not on file   Social Drivers of Health   Tobacco Use: High Risk (12/04/2024)   Patient History    Smoking Tobacco Use: Every Day    Smokeless Tobacco Use: Never    Passive Exposure: Not on file  Financial Resource Strain: Not on file  Food Insecurity: Not on file  Transportation Needs: Not on file  Physical Activity: Not on file  Stress: Not on file  Social Connections: Unknown (04/17/2022)   [facility]   Social Network    Social Network: Not on file  Depression (PHQ2-9): Not on file  Alcohol Screen: Not on file  Housing: Not on file  Utilities: Not on file  Health Literacy: Not on file     Family History: The patient's family history includes Alzheimer's disease in her father and mother; Bone cancer in her brother; Breast cancer in her maternal aunt; Congestive Heart Failure in her brother and father; Heart attack in her mother; Hypertension in her brother and mother; Prostate cancer in her brother; Stroke in her mother; Thyroid  disease in her father and mother. ROS:   Please see the history of present illness.    All 14 point review of systems negative except as described per history of present illness  EKGs/Labs/Other Studies Reviewed:    EKG Interpretation Date/Time:  Friday December 04 2024 13:19:53 EST Ventricular Rate:  63 PR Interval:  176 QRS Duration:  74 QT Interval:  396 QTC Calculation: 405 R Axis:   27  Text Interpretation: Normal sinus rhythm Cannot rule out Anterior infarct , age undetermined Abnormal ECG When compared with ECG of 22-Oct-2022 12:32, No significant change was found Confirmed  by Bernie Charleston [phone]) on 12/04/2024 1:26:34 PM    Recent Labs: No results found for requested labs within last 365 days.  Recent Lipid Panel    Component Value Date/Time   CHOL 141 04/04/2023 1324   TRIG 134 04/04/2023 1324   HDL 67 04/04/2023 1324   CHOLHDL 2.1 04/04/2023 1324   LDLCALC 51 04/04/2023 1324    Physical Exam:    VS:  BP (!) 142/78   Pulse 63   Ht 5' 1 (1.549 m)   Wt 197 lb (89.4 kg)   SpO2 96%   BMI 37.22 kg/m     Wt Readings from Last 3 Encounters:  12/04/24 197 lb (89.4 kg)  04/15/24 214 lb 3.2 oz (97.2 kg)  10/16/23 219 lb 3.2 oz (99.4 kg)     GEN:  Well nourished, well developed in no  acute distress HEENT: Normal NECK: No JVD; No carotid bruits LYMPHATICS: No lymphadenopathy CARDIAC: RRR, no murmurs, no rubs, no gallops RESPIRATORY:  Clear to auscultation without rales, wheezing or rhonchi  ABDOMEN: Soft, non-tender, non-distended MUSCULOSKELETAL:  No edema; No deformity  SKIN: Warm and dry LOWER EXTREMITIES: no swelling NEUROLOGIC:  Alert and oriented x 3 PSYCHIATRIC:  Normal affect   ASSESSMENT:    1. Coronary artery disease involving native coronary artery of native heart without angina pectoris   2. Hypertension, essential, benign   3. Dyslipidemia    PLAN:    In order of problems listed above:  Coronary disease stable without any symptoms no chest pain tightness squeezing pressure burning chest.  Will continue present management with current medications setting. Essential hypertension blood pressure slightly on the high side but she said she always checks at home and always good we will continue monitoring. Dyslipidemia I did review KPN which show me total cholesterol 147 HDL 53 we will continue monitoring   Medication Adjustments/Labs and Tests Ordered: Current medicines are reviewed at length with the patient today.  Concerns regarding medicines are outlined above.  Orders Placed This Encounter  Procedures   EKG 12-Lead    Medication changes: No orders of the defined types were placed in this encounter.   Signed, Lamar DOROTHA Fitch, MD, [facility] 12/04/2024 1:36 PM    [facility]    [1]  Current Meds  Medication Sig   albuterol  (VENTOLIN  HFA) 108 (90 Base) MCG/ACT inhaler Inhale 2 puffs into the lungs every 6 (six) hours as needed for wheezing or shortness of breath.   ALPRAZolam  (XANAX ) 0.5 MG tablet Take 0.5 mg by mouth 3 (three) times daily as needed for anxiety.   amLODipine  (NORVASC ) 10 MG tablet Take 10 mg by mouth daily.   aspirin  EC 81 MG tablet Take 81 mg by mouth daily. Swallow whole.   clopidogrel  (PLAVIX ) 75 MG tablet Take 75 mg by mouth daily.   ezetimibe  (ZETIA ) 10 MG tablet Take 10 mg by mouth daily.   hydrochlorothiazide  (HYDRODIURIL ) 12.5 MG tablet Take 12.5 mg by mouth daily.   lisinopril (ZESTRIL) 40 MG tablet Take 40 mg by mouth at bedtime.   loratadine (ALLERGY RELIEF) 10 MG tablet Take 10 mg by mouth daily as needed for allergies.   Magnesium 250 MG CAPS Take 250 mg by mouth daily.   meclizine (ANTIVERT) 25 MG tablet Take 25 mg by mouth 3 (three) times daily as needed for dizziness.   metoprolol  succinate (TOPROL -XL) 25 MG 24 hr tablet Take 25 mg by mouth daily.   Multiple Vitamins-Minerals (WOMENS MULTI PO) Take 1 tablet by mouth daily.   nitroGLYCERIN  (NITROSTAT ) 0.4 MG SL tablet Place 1 tablet (0.4 mg total) under the tongue every 5 (five) minutes as needed for chest pain.   pantoprazole  (PROTONIX ) 40 MG tablet TAKE 1 TABLET BY MOUTH EVERY DAY   rosuvastatin  (CRESTOR ) 20 MG tablet Take 1 tablet (20 mg total) by mouth daily.   Semaglutide (OZEMPIC, 0.25 OR 0.5 MG/DOSE, [place]) Inject 0.25 mg into the skin once a week.   "

## 2024-12-04 NOTE — Patient Instructions (Addendum)
 Medication Instructions:  Your physician recommends that you continue on your current medications as directed. Please refer to the Current Medication list given to you today.  *If you need a refill on your cardiac medications before your next appointment, please call your pharmacy*   Lab Work: None Ordered If you have labs (blood work) drawn today and your tests are completely normal, you will receive your results only by: MyChart Message (if you have MyChart) OR A paper copy in the mail If you have any lab test that is abnormal or we need to change your treatment, we will call you to review the results.   Testing/Procedures: None Ordered   Follow-Up: At [facility], you and your health needs are our priority.  As part of our continuing mission to provide you with exceptional heart care, we have created designated Provider Care Teams.  These Care Teams include your primary Cardiologist (physician) and Advanced Practice Providers (APPs -  Physician Assistants and Nurse Practitioners) who all work together to provide you with the care you need, when you need it.  We recommend signing up for the patient portal called MyChart.  Sign up information is provided on this After Visit Summary.  MyChart is used to connect with patients for Virtual Visits (Telemedicine).  Patients are able to view lab/test results, encounter notes, upcoming appointments, etc.  Non-urgent messages can be sent to your provider as well.   To learn more about what you can do with MyChart, go to [email or web address].    Your next appointment:   6 month(s)  The format for your next appointment:   In Person  Provider:   Lamar Fitch, MD    Other Instructions Appt with Pharm D-

## 2025-01-04 NOTE — Progress Notes (Signed)
 SOLIYANA MCCHRISTIAN                                          MRN: [id number]   01/04/2025   The VBCI Quality Team Specialist reviewed this patient medical record for the purposes of chart review for care gap closure. The following were reviewed: abstraction for care gap closure-controlling blood pressure.    VBCI Quality Team

## 2025-01-13 ENCOUNTER — Ambulatory Visit: Admitting: Pharmacist Clinician (PhC)/ Clinical Pharmacy Specialist
